# Patient Record
Sex: Female | Born: 1940 | Race: White | Hispanic: No | Marital: Married | Smoking: Former smoker
Health system: Southern US, Community
[De-identification: ages and names within clinical notes are randomized; demographics above are authoritative.]

## PROBLEM LIST (undated history)

## (undated) DIAGNOSIS — R030 Elevated blood-pressure reading, without diagnosis of hypertension: Secondary | ICD-10-CM

## (undated) DIAGNOSIS — J449 Chronic obstructive pulmonary disease, unspecified: Secondary | ICD-10-CM

---

## 2004-03-15 ENCOUNTER — Ambulatory Visit: Payer: Self-pay | Admitting: Family Medicine

## 2004-04-05 ENCOUNTER — Ambulatory Visit: Payer: Self-pay | Admitting: Family Medicine

## 2004-05-17 ENCOUNTER — Ambulatory Visit: Payer: Self-pay

## 2006-07-11 ENCOUNTER — Ambulatory Visit: Payer: Self-pay | Admitting: Gastroenterology

## 2009-08-15 ENCOUNTER — Ambulatory Visit: Payer: Self-pay | Admitting: Gastroenterology

## 2012-10-21 ENCOUNTER — Ambulatory Visit: Payer: Self-pay | Admitting: Family Medicine

## 2015-12-18 ENCOUNTER — Inpatient Hospital Stay
Admission: EM | Admit: 2015-12-18 | Discharge: 2015-12-28 | DRG: 208 | Disposition: A | Discharge status: Skilled Nursing Facility | Payer: Medicare Other | Attending: Internal Medicine | Admitting: Internal Medicine

## 2015-12-18 ENCOUNTER — Emergency Department: Payer: Medicare Other

## 2015-12-18 DIAGNOSIS — J9622 Acute and chronic respiratory failure with hypercapnia: Secondary | ICD-10-CM | POA: Diagnosis present

## 2015-12-18 DIAGNOSIS — R0603 Acute respiratory distress: Secondary | ICD-10-CM

## 2015-12-18 DIAGNOSIS — R0689 Other abnormalities of breathing: Secondary | ICD-10-CM | POA: Diagnosis present

## 2015-12-18 DIAGNOSIS — Z720 Tobacco use: Secondary | ICD-10-CM | POA: Diagnosis not present

## 2015-12-18 DIAGNOSIS — R410 Disorientation, unspecified: Secondary | ICD-10-CM | POA: Diagnosis not present

## 2015-12-18 DIAGNOSIS — G9341 Metabolic encephalopathy: Secondary | ICD-10-CM | POA: Diagnosis present

## 2015-12-18 DIAGNOSIS — J9690 Respiratory failure, unspecified, unspecified whether with hypoxia or hypercapnia: Secondary | ICD-10-CM | POA: Diagnosis present

## 2015-12-18 DIAGNOSIS — Z9981 Dependence on supplemental oxygen: Secondary | ICD-10-CM

## 2015-12-18 DIAGNOSIS — J208 Acute bronchitis due to other specified organisms: Secondary | ICD-10-CM | POA: Diagnosis present

## 2015-12-18 DIAGNOSIS — J209 Acute bronchitis, unspecified: Secondary | ICD-10-CM | POA: Diagnosis not present

## 2015-12-18 DIAGNOSIS — J9621 Acute and chronic respiratory failure with hypoxia: Secondary | ICD-10-CM | POA: Diagnosis present

## 2015-12-18 DIAGNOSIS — Z6821 Body mass index (BMI) 21.0-21.9, adult: Secondary | ICD-10-CM

## 2015-12-18 DIAGNOSIS — F17200 Nicotine dependence, unspecified, uncomplicated: Secondary | ICD-10-CM | POA: Diagnosis present

## 2015-12-18 DIAGNOSIS — Z4659 Encounter for fitting and adjustment of other gastrointestinal appliance and device: Secondary | ICD-10-CM

## 2015-12-18 DIAGNOSIS — J440 Chronic obstructive pulmonary disease with acute lower respiratory infection: Secondary | ICD-10-CM | POA: Diagnosis present

## 2015-12-18 DIAGNOSIS — E440 Moderate protein-calorie malnutrition: Secondary | ICD-10-CM | POA: Diagnosis present

## 2015-12-18 DIAGNOSIS — Z7951 Long term (current) use of inhaled steroids: Secondary | ICD-10-CM | POA: Diagnosis not present

## 2015-12-18 DIAGNOSIS — M6281 Muscle weakness (generalized): Secondary | ICD-10-CM

## 2015-12-18 DIAGNOSIS — J9601 Acute respiratory failure with hypoxia: Secondary | ICD-10-CM | POA: Diagnosis not present

## 2015-12-18 DIAGNOSIS — R2681 Unsteadiness on feet: Secondary | ICD-10-CM

## 2015-12-18 DIAGNOSIS — R0600 Dyspnea, unspecified: Secondary | ICD-10-CM

## 2015-12-18 DIAGNOSIS — R319 Hematuria, unspecified: Secondary | ICD-10-CM

## 2015-12-18 DIAGNOSIS — E873 Alkalosis: Secondary | ICD-10-CM | POA: Diagnosis present

## 2015-12-18 DIAGNOSIS — Z978 Presence of other specified devices: Secondary | ICD-10-CM

## 2015-12-18 DIAGNOSIS — J441 Chronic obstructive pulmonary disease with (acute) exacerbation: Principal | ICD-10-CM

## 2015-12-18 DIAGNOSIS — Z79899 Other long term (current) drug therapy: Secondary | ICD-10-CM

## 2015-12-18 DIAGNOSIS — J9602 Acute respiratory failure with hypercapnia: Secondary | ICD-10-CM | POA: Diagnosis not present

## 2015-12-18 DIAGNOSIS — I10 Essential (primary) hypertension: Secondary | ICD-10-CM | POA: Diagnosis not present

## 2015-12-18 DIAGNOSIS — R310 Gross hematuria: Secondary | ICD-10-CM

## 2015-12-18 DIAGNOSIS — I471 Supraventricular tachycardia: Secondary | ICD-10-CM | POA: Diagnosis not present

## 2015-12-18 DIAGNOSIS — Z9911 Dependence on respirator [ventilator] status: Secondary | ICD-10-CM | POA: Diagnosis not present

## 2015-12-18 DIAGNOSIS — J9600 Acute respiratory failure, unspecified whether with hypoxia or hypercapnia: Secondary | ICD-10-CM

## 2015-12-18 DIAGNOSIS — E872 Acidosis: Secondary | ICD-10-CM | POA: Diagnosis not present

## 2015-12-18 HISTORY — DX: Elevated blood-pressure reading, without diagnosis of hypertension: R03.0

## 2015-12-18 HISTORY — DX: Chronic obstructive pulmonary disease, unspecified: J44.9

## 2015-12-18 LAB — CBC WITH DIFFERENTIAL/PLATELET
BASOS PCT: 0 %
Basophils Absolute: 0 10*3/uL (ref 0–0.1)
EOS ABS: 0 10*3/uL (ref 0–0.7)
EOS PCT: 0 %
HCT: 45.6 % (ref 35.0–47.0)
HEMOGLOBIN: 14.9 g/dL (ref 12.0–16.0)
LYMPHS ABS: 0.9 10*3/uL — AB (ref 1.0–3.6)
Lymphocytes Relative: 6 %
MCH: 32 pg (ref 26.0–34.0)
MCHC: 32.7 g/dL (ref 32.0–36.0)
MCV: 98.1 fL (ref 80.0–100.0)
MONO ABS: 1.2 10*3/uL — AB (ref 0.2–0.9)
MONOS PCT: 9 %
NEUTROS PCT: 85 %
Neutro Abs: 11.7 10*3/uL — ABNORMAL HIGH (ref 1.4–6.5)
PLATELETS: 318 10*3/uL (ref 150–440)
RBC: 4.65 MIL/uL (ref 3.80–5.20)
RDW: 13.3 % (ref 11.5–14.5)
WBC: 13.8 10*3/uL — ABNORMAL HIGH (ref 3.6–11.0)

## 2015-12-18 LAB — COMPREHENSIVE METABOLIC PANEL
ALBUMIN: 3.8 g/dL (ref 3.5–5.0)
ALK PHOS: 97 U/L (ref 38–126)
ALT: 15 U/L (ref 14–54)
ANION GAP: 9 (ref 5–15)
AST: 29 U/L (ref 15–41)
BILIRUBIN TOTAL: 0.5 mg/dL (ref 0.3–1.2)
BUN: 25 mg/dL — ABNORMAL HIGH (ref 6–20)
CALCIUM: 9.8 mg/dL (ref 8.9–10.3)
CO2: 41 mmol/L — ABNORMAL HIGH (ref 22–32)
CREATININE: 0.64 mg/dL (ref 0.44–1.00)
Chloride: 92 mmol/L — ABNORMAL LOW (ref 101–111)
GFR calc Af Amer: >60 mL/min (ref >60)
GFR calc non Af Amer: >60 mL/min (ref >60)
GLUCOSE: 190 mg/dL — AB (ref 65–99)
Potassium: 3.9 mmol/L (ref 3.5–5.1)
Sodium: 142 mmol/L (ref 135–145)
TOTAL PROTEIN: 7.8 g/dL (ref 6.5–8.1)

## 2015-12-18 LAB — BLOOD GAS, VENOUS
Delivery systems: POSITIVE
FIO2: 0.36
Patient temperature: 37
pCO2, Ven: 120 mmHg (ref 44.0–60.0)
pH, Ven: 7.15 — CL (ref 7.250–7.430)

## 2015-12-18 LAB — INFLUENZA PANEL BY PCR (TYPE A & B)
INFLAPCR: NEGATIVE
Influenza B By PCR: NEGATIVE

## 2015-12-18 LAB — BLOOD GAS, ARTERIAL
Acid-Base Excess: 16 mmol/L — ABNORMAL HIGH (ref 0.0–2.0)
Bicarbonate: 45.4 mmol/L — ABNORMAL HIGH (ref 20.0–28.0)
DELIVERY SYSTEMS: POSITIVE
Expiratory PAP: 10
FIO2: 0.36
INSPIRATORY PAP: 22
O2 Saturation: 89.4 %
PO2 ART: 58 mmHg — AB (ref 83.0–108.0)
Patient temperature: 37
pCO2 arterial: 75 mmHg (ref 32.0–48.0)
pH, Arterial: 7.39 (ref 7.350–7.450)

## 2015-12-18 LAB — LACTIC ACID, PLASMA: Lactic Acid, Venous: 1.5 mmol/L (ref 0.5–1.9)

## 2015-12-18 LAB — PROTIME-INR
INR: 1.04
PROTHROMBIN TIME: 13.6 s (ref 11.4–15.2)

## 2015-12-18 LAB — MRSA PCR SCREENING: MRSA by PCR: NEGATIVE

## 2015-12-18 MED ORDER — ALBUTEROL SULFATE (2.5 MG/3ML) 0.083% IN NEBU
2.5000 mg | INHALATION_SOLUTION | Freq: Once | RESPIRATORY_TRACT | Status: AC
  Administered 2015-12-18: 2.5 mg via RESPIRATORY_TRACT

## 2015-12-18 MED ORDER — ACETAMINOPHEN 325 MG PO TABS
650.0000 mg | ORAL_TABLET | ORAL | Status: DC | PRN
  Administered 2015-12-25 – 2015-12-27 (×2): 650 mg via ORAL
  Filled 2015-12-18 (×2): qty 2

## 2015-12-18 MED ORDER — ALBUTEROL SULFATE (2.5 MG/3ML) 0.083% IN NEBU
INHALATION_SOLUTION | RESPIRATORY_TRACT | Status: AC
Start: 2015-12-18 — End: 2015-12-18
  Administered 2015-12-18: 2.5 mg via RESPIRATORY_TRACT
  Filled 2015-12-18: qty 12

## 2015-12-18 MED ORDER — SODIUM CHLORIDE 0.9% FLUSH: 3.0000 mL | INTRAVENOUS | Status: DC | PRN

## 2015-12-18 MED ORDER — SODIUM CHLORIDE 0.9 % IV SOLN: 250.0000 mL | INTRAVENOUS | Status: DC | PRN

## 2015-12-18 MED ORDER — ENOXAPARIN SODIUM 40 MG/0.4ML ~~LOC~~ SOLN
40.0000 mg | SUBCUTANEOUS | Status: DC
  Administered 2015-12-18 – 2015-12-19 (×2): 40 mg via SUBCUTANEOUS
  Filled 2015-12-18 (×2): qty 0.4

## 2015-12-18 MED ORDER — METHYLPREDNISOLONE SODIUM SUCC 40 MG IJ SOLR
40.0000 mg | Freq: Two times a day (BID) | INTRAMUSCULAR | Status: DC
  Administered 2015-12-18 – 2015-12-19 (×2): 40 mg via INTRAVENOUS
  Filled 2015-12-18 (×2): qty 1

## 2015-12-18 MED ORDER — IPRATROPIUM-ALBUTEROL 0.5-2.5 (3) MG/3ML IN SOLN
3.0000 mL | RESPIRATORY_TRACT | Status: DC
  Administered 2015-12-18 – 2015-12-19 (×8): 3 mL via RESPIRATORY_TRACT
  Filled 2015-12-18 (×8): qty 3

## 2015-12-18 MED ORDER — HYDRALAZINE HCL 20 MG/ML IJ SOLN
5.0000 mg | INTRAMUSCULAR | Status: DC | PRN
  Administered 2015-12-18 – 2015-12-23 (×5): 5 mg via INTRAVENOUS
  Filled 2015-12-18 (×5): qty 1

## 2015-12-18 MED ORDER — MORPHINE SULFATE (PF) 4 MG/ML IV SOLN
2.0000 mg | INTRAVENOUS | Status: DC | PRN
  Administered 2015-12-18 – 2015-12-24 (×2): 2 mg via INTRAVENOUS
  Filled 2015-12-18 (×2): qty 1

## 2015-12-18 MED ORDER — ORAL CARE MOUTH RINSE
15.0000 mL | Freq: Two times a day (BID) | OROMUCOSAL | Status: DC
  Administered 2015-12-18 – 2015-12-21 (×7): 15 mL via OROMUCOSAL

## 2015-12-18 MED ORDER — CEFTRIAXONE SODIUM-DEXTROSE 1-3.74 GM-% IV SOLR
1.0000 g | INTRAVENOUS | Status: AC
  Administered 2015-12-18 – 2015-12-23 (×6): 1 g via INTRAVENOUS
  Filled 2015-12-18 (×8): qty 50

## 2015-12-18 MED ORDER — ONDANSETRON HCL 4 MG/2ML IJ SOLN: 4.0000 mg | Freq: Four times a day (QID) | INTRAMUSCULAR | Status: DC | PRN

## 2015-12-18 MED ORDER — GUAIFENESIN 100 MG/5ML PO SOLN
5.0000 mL | ORAL | Status: DC | PRN
  Administered 2015-12-18 – 2015-12-25 (×2): 100 mg via ORAL
  Filled 2015-12-18 (×2): qty 10

## 2015-12-18 MED ORDER — BUDESONIDE 0.5 MG/2ML IN SUSP
0.5000 mg | Freq: Two times a day (BID) | RESPIRATORY_TRACT | Status: DC
  Administered 2015-12-18 – 2015-12-28 (×20): 0.5 mg via RESPIRATORY_TRACT
  Filled 2015-12-18 (×22): qty 2

## 2015-12-18 MED ORDER — SODIUM CHLORIDE 0.9% FLUSH
3.0000 mL | Freq: Two times a day (BID) | INTRAVENOUS | Status: DC
  Administered 2015-12-18 – 2015-12-27 (×19): 3 mL via INTRAVENOUS

## 2015-12-18 MED ORDER — DEXTROSE 5 % IV SOLN
500.0000 mg | INTRAVENOUS | Status: AC
  Administered 2015-12-18 – 2015-12-23 (×6): 500 mg via INTRAVENOUS
  Filled 2015-12-18 (×6): qty 500

## 2015-12-18 MED ORDER — FAMOTIDINE IN NACL 20-0.9 MG/50ML-% IV SOLN
20.0000 mg | Freq: Two times a day (BID) | INTRAVENOUS | Status: DC
  Administered 2015-12-18 – 2015-12-25 (×15): 20 mg via INTRAVENOUS
  Filled 2015-12-18 (×15): qty 50

## 2015-12-18 NOTE — ED Triage Notes (Signed)
Pt came to ED via EMS from home. Per EMS, pts sister found pt slumped over in chair. On arrival EMS repots satting 88% r/a. Given 2 duonebs, 2 albuterol treatments, 125 solumedrol, 2 mg magnesium, and placed on cpap.

## 2015-12-18 NOTE — ED Notes (Signed)
Pt now becoming more alert. Able to state name and follow some commands.

## 2015-12-18 NOTE — Progress Notes (Signed)
Patient transferred from ER to ICU bed 17, while on the V60 BIPAP, with no complications. Patients IPAP was increased to 22cmh20 by Dr.Kasa while he was in assessing the patient in the Er. Current settings are 22/10 and 36%. Patient tol well at this time.

## 2015-12-18 NOTE — H&P (Signed)
PULMONARY / CRITICAL CARE MEDICINE   Name: Claudia Mercado MRN: [id number] DOB: [birth year]    ADMISSION DATE:  12/18/2015  CHIEF COMPLAINT:  resp failure    HISTORY OF PRESENT ILLNESS:  75 y.o. female with a history of COPD who was found by her sister reportedly slumped over and barely breathing. -EMS stated that her O2 sat was 88% on maybe 3 L outpatient nasal cannula oxygen, but minimally responsive, extremely tight/not moving air, and end-tidal CO2 reading in the 80s.  -She was placed on BiPAP and given magnesium, 2 or 3 DuoNeb's, and Solu-Medrol and was starting to wake up to voice and interact at the time upon ED arrival. -however at time of my arrival, patient remains lethargic increased WOB, on biPAP Has b/l wheezing and rhonchi  Patient sisiter at bedside, patient wears 2 L [place] at home and still smokes Patient at very high risk for intubation  PAST MEDICAL HISTORY :   has a past medical history of COPD (chronic obstructive pulmonary disease) (HCC).  has no past surgical history on file. Prior to Admission medications   Medication Sig Start Date End Date Taking? Authorizing Provider  atorvastatin (LIPITOR) 40 MG tablet Take 1 tablet by mouth daily. 12/29/14 12/29/15 Yes Historical Provider, MD  gabapentin (NEURONTIN) 300 MG capsule Take 1 capsule by mouth 3 (three) times daily. 12/11/15  Yes Historical Provider, MD  SPIRIVA HANDIHALER 18 MCG inhalation capsule Place 1 capsule into inhaler and inhale as needed. 12/02/15  Yes Historical Provider, MD  SYMBICORT 160-4.5 MCG/ACT inhaler Inhale 2 puffs into the lungs 2 (two) times daily. 11/19/15  Yes Historical Provider, MD  VENTOLIN HFA 108 (90 Base) MCG/ACT inhaler Inhale 2 puffs into the lungs every 4 (four) hours as needed. 12/07/15  Yes Historical Provider, MD  nitroGLYCERIN (NITROSTAT) 0.4 MG SL tablet Place 1 tablet under the tongue every 5 (five) minutes x 3 doses as needed. Place 1 tablet under tongue every 5 minutes as needed  for chest pain for up to 3 doses 09/27/15   Historical Provider, MD    REVIEW OF SYSTEMS:  Unobtainable due to critical illness    VITAL SIGNS: Temp:  [97.4 F (36.3 C)] 97.4 F (36.3 C) (12/03 1304) Pulse Rate:  [95-103] 103 (12/03 1330) Resp:  [23-36] 23 (12/03 1330) BP: (118-152)/(76-87) 118/84 (12/03 1330) SpO2:  [96 %-98 %] 98 % (12/03 1330) Weight:  [130 lb (59 kg)] 130 lb (59 kg) (12/03 1337) HEMODYNAMICS:   PHYSICAL EXAMINATION:  GENERAL:critically ill appearing, +resp distress HEAD: Normocephalic, atraumatic.  EYES: Pupils equal, round, reactive to light.  No scleral icterus.  MOUTH: Moist mucosal membrane. NECK: Supple. No thyromegaly. No nodules. No JVD.  PULMONARY: +rhonchi, +wheezing CARDIOVASCULAR: S1 and S2. Regular rate and rhythm. No murmurs, rubs, or gallops.  GASTROINTESTINAL: Soft, nontender, -distended. No masses. Positive bowel sounds. No hepatosplenomegaly.  MUSCULOSKELETAL: No swelling, clubbing, or edema.  NEUROLOGIC: obtunded SKIN:intact,warm,dry    LABS:  CBC  Recent Labs Lab 12/18/15 1256  WBC 13.8*  HGB 14.9  HCT 45.6  PLT 318   Coag's No results for input(s): APTT, INR in the last 168 hours. BMET No results for input(s): NA, K, CL, CO2, BUN, CREATININE, GLUCOSE in the last 168 hours. Electrolytes No results for input(s): CALCIUM, MG, PHOS in the last 168 hours. Sepsis Markers  Recent Labs Lab 12/18/15 1307  LATICACIDVEN 1.5   ABG No results for input(s): PHART, PCO2ART, PO2ART in the last 168 hours. Liver Enzymes No results for  input(s): AST, ALT, ALKPHOS, BILITOT, ALBUMIN in the last 168 hours. Cardiac Enzymes No results for input(s): TROPONINI, PROBNP in the last 168 hours. Glucose No results for input(s): GLUCAP in the last 168 hours.  Imaging Dg Chest Portable 1 View  Result Date: 12/18/2015 CLINICAL DATA:  75 year old female with history of respiratory distress. History of COPD. EXAM: PORTABLE CHEST 1 VIEW  COMPARISON:  No priors. FINDINGS: Probable small to moderate-sized subpulmonic fluid collection in the right hemithorax. No acute consolidative airspace disease. No pneumothorax. Emphysematous changes. No evidence of pulmonary edema. Mild diffuse peribronchial cuffing. Heart size is normal. The patient is rotated to the right on today's exam, resulting in distortion of the mediastinal contours and reduced diagnostic sensitivity and specificity for mediastinal pathology. Atherosclerosis in the thoracic aorta. IMPRESSION: 1. Probable mild small to moderate right subpulmonic pleural effusion. 2. Diffuse peribronchial cuffing, concerning for an acute bronchitis. 3. Aortic atherosclerosis. 4. Emphysema. Electronically Signed   By: Trudie Reedaniel  Entrikin M.D.   On: 12/18/2015 13:51     ASSESSMENT / PLAN:   75 yo white female with acute in chronic resp failure from acute COPD exacerbation likely from viral bronchitis   PULMONARY -Respiratory Failure -attempt BiPAP therapy -aggressive BD therapy IV steroids and IV abx Titrate fio2 to keep sats 88-92% High risk for intubation  CARDIOVASCULAR Will need ICU monitoring  RENAL Watch uop  GASTROINTESTINAL Keep NPO  HEMATOLOGIC Follow CBC as needed  INFECTIOUS Empiric abx -check FLU Panel   NEUROLOGIC encephlaopthy from hyerpcarbia -avoid benzo's Morphine as needed for increased WOB   DVT prx with lovenox FAMILY  - Updates: sister updated   I have personally obtained a history, examined the patient, evaluated Pertinent laboratory and Radio[employer]/imaging results, and  formulated the assessment and plan   The Patient requires high complexity decision making for assessment and support, frequent evaluation and titration of therapies, application of advanced monitoring technologies and extensive interpretation of multiple databases. Critical Care Time devoted to patient care services described in this note is 45 minutes.   Overall, patient  is critically ill, prognosis is guarded.   high risk for cardiac arrest and death.    Lucie LeatherKurian David Astin Rape, M.D.  [facility]  Medical Director [facility] Medical Director [facility]

## 2015-12-18 NOTE — ED Notes (Signed)
X-ray at bedside

## 2015-12-18 NOTE — ED Provider Notes (Signed)
[facility] Emergency Department Provider Note ____________________________________________   I have reviewed the triage vital signs and the triage nursing note.  HISTORY  Chief Complaint Respiratory Distress   Historian Level V caveat, altered mental status, respiratory distress, severe illness History per EMS report from sister with the patient was picked up  HPI Claudia Mercado is a 75 y.o. female with a history of COPD who was found by her sister reportedly slumped over and barely breathing. EMS stated that her O2 sat was 88% on maybe 3 L outpatient nasal cannula oxygen, but minimally responsive, extremely tight/not moving air, and end-tidal CO2 reading in the 80s. She was placed on BiPAP and given magnesium, 2 or 3 DuoNeb's, and Solu-Medrol and was starting to wake up to voice and interact at the time upon ED arrival.  No additional information was available in terms of fevers, cough, or onset.  Patient shakes her head no about any chest pain.   Past Medical History:  Diagnosis Date  . COPD (chronic obstructive pulmonary disease) (HCC)     There are no active problems to display for this patient.   History reviewed. No pertinent surgical history.  Prior to Admission medications   Medication Sig Start Date End Date Taking? Authorizing Provider  atorvastatin (LIPITOR) 40 MG tablet Take 1 tablet by mouth daily. 12/29/14 12/29/15 Yes Historical Provider, MD  gabapentin (NEURONTIN) 300 MG capsule Take 1 capsule by mouth 3 (three) times daily. 12/11/15  Yes Historical Provider, MD  SPIRIVA HANDIHALER 18 MCG inhalation capsule Place 1 capsule into inhaler and inhale as needed. 12/02/15  Yes Historical Provider, MD  SYMBICORT 160-4.5 MCG/ACT inhaler Inhale 2 puffs into the lungs 2 (two) times daily. 11/19/15  Yes Historical Provider, MD  VENTOLIN HFA 108 (90 Base) MCG/ACT inhaler Inhale 2 puffs into the lungs every 4 (four) hours as needed. 12/07/15  Yes  Historical Provider, MD  nitroGLYCERIN (NITROSTAT) 0.4 MG SL tablet Place 1 tablet under the tongue every 5 (five) minutes x 3 doses as needed. Place 1 tablet under tongue every 5 minutes as needed for chest pain for up to 3 doses 09/27/15   Historical Provider, MD    No Known Allergies  No family history on file.  Social History Social History  Substance Use Topics  . Smoking status: Not on file  . Smokeless tobacco: Not on file  . Alcohol use Not on file    Review of Systems Unable to obtain due to critical illness and altered mental status, respiratory distress  Denies chest pain.   ____________________________________________   PHYSICAL EXAM:  VITAL SIGNS: ED Triage Vitals  Enc Vitals Group     BP 12/18/15 1304 (!) 152/87     Pulse Rate 12/18/15 1250 95     Resp 12/18/15 1250 (!) 36     Temp 12/18/15 1304 97.4 F (36.3 C)     Temp Source 12/18/15 1304 Axillary     SpO2 12/18/15 1250 96 %     Weight --      Height 12/18/15 1304 5\' 5"  (1.651 m)     Head Circumference --      Peak Flow --      Pain Score --      Pain Loc --      Pain Edu? --      Excl. in GC? --      Constitutional: Opens eyes to voice and shakes head yes and no. Still with respiratory distress.  HEENT  Head: Normocephalic and atraumatic.      Eyes: Conjunctivae are normal. PERRL. Normal extraocular movements.      Ears:         Nose: No congestion/rhinnorhea.   Mouth/Throat: Mucous membranes are mildly dry.   Neck: No stridor.   Cardiovascular/Chest: Normal rate, regular rhythm.  No murmurs, rubs, or gallops. Respiratory: Very little air movement, tachypnea, no rhonchi or rales. Gastrointestinal: Soft. No distention.  Genitourinary/rectal:Deferred Musculoskeletal: Normal extremities.. No lower extremity edema. Neurologic:  No facial droop. Follows commands. Limited verbal due to respiratory distress. No gross or focal neurologic deficits are appreciated. Skin:  Skin is warm, dry  and intact. No rash noted.   ____________________________________________  LABS (pertinent positives/negatives)  Labs Reviewed  BLOOD GAS, VENOUS - Abnormal; Notable for the following:       Result Value   pCO2, Ven 120 (*)    All other components within normal limits  CULTURE, BLOOD (ROUTINE X 2)  CULTURE, BLOOD (ROUTINE X 2)  LACTIC ACID, PLASMA  COMPREHENSIVE METABOLIC PANEL  LACTIC ACID, PLASMA  CBC WITH DIFFERENTIAL/PLATELET  PROTIME-INR    ____________________________________________    EKG I, Governor Rooks, MD, the attending physician have personally viewed and interpreted all ECGs.  102 bpm. Sinus tachycardia. Narrow QRS. Normal axis. Normal ST and T-wave ____________________________________________  RADIOLOGY All Xrays were viewed by me. Imaging interpreted by Radiologist.  Chest x-ray portable:  IMPRESSION: 1. Probable mild small to moderate right subpulmonic pleural effusion. 2. Diffuse peribronchial cuffing, concerning for an acute bronchitis. 3. Aortic atherosclerosis. 4. Emphysema. __________________________________________  PROCEDURES  Procedure(s) performed: None  Critical Care performed: CRITICAL CARE Performed by: Governor Rooks   Total critical care time: 30 minutes  Critical care time was exclusive of separately billable procedures and treating other patients.  Critical care was necessary to treat or prevent imminent or life-threatening deterioration.  Critical care was time spent personally by me on the following activities: development of treatment plan with patient and/or surrogate as well as nursing, discussions with consultants, evaluation of patient's response to treatment, examination of patient, obtaining history from patient or surrogate, ordering and performing treatments and interventions, ordering and review of laboratory studies, ordering and review of radiographic studies, pulse oximetry and re-evaluation of patient's  condition.   ____________________________________________   ED COURSE / ASSESSMENT AND PLAN  Pertinent labs & imaging results that were available during my care of the patient were reviewed by me and considered in my medical decision making (see chart for details).  Ms. Siebel was brought in emergency traffic by EMS for respiratory distress and altered mental status. She is responding to voice now and per EMS this is a significant improvement from when they found her. Symptoms seem most consistent with likely COPD exacerbation and hypercarbia which is now starting to be somewhat improved.  Chest x-ray shows no focal infiltrate. She's not febrile. Patient was transitioned over to BiPAP. Respiratory rate improved from 40s to 30s. Mental status continued to improve over time in the emergency department.  She received slight Medrol magnesium prior to arrival. She was given a continuous 10 mg albuterol nebulizer in the emergency department.  I discussed with the intensivist Dr. Belia Heman for hospital admission.    CONSULTATIONS: ICU physician for admission.  Patient / Family / Caregiver informed of clinical course, medical decision-making process, and agree with plan.     ___________________________________________   FINAL CLINICAL IMPRESSION(S) / ED DIAGNOSES   Final diagnoses:  COPD exacerbation (HCC)  Respiratory distress  Acute respiratory failure with hypoxia (HCC)  Hypercarbia              Note: This dictation was prepared with Dragon dictation. Any transcriptional errors that result from this process are unintentional    Governor Rooksebecca Leanord Thibeau, MD 12/18/15 1418

## 2015-12-18 NOTE — Progress Notes (Signed)
Pharmacy Antibiotic Note  Josephina Shiheggy F Daino is a 75 y.o. female admitted on 12/18/2015 with COPD exacerbation, Respiratory distress/acutre respiratory failure, and possible PNA.    Pharmacy has been consulted for Ceftriaxone and Azithromycin dosing.  Plan: Will start the patient on Ceftriaxone 1gm IV every 24 hours and Azithromycin 500mg  IV every 24 hours.   Height: 5\' 5"  (165.1 cm) Weight: 130 lb (59 kg) IBW/kg (Calculated) : 57  Temp (24hrs), Avg:97.4 F (36.3 C), Min:97.4 F (36.3 C), Max:97.4 F (36.3 C)   Recent Labs Lab 12/18/15 1256 12/18/15 1307  WBC 13.8*  --   LATICACIDVEN  --  1.5    CrCl cannot be calculated (No order found.).    No Known Allergies  Antimicrobials this admission: 12/3 Ceftriaxone >>  12/3 Azithromycin >>   Dose adjustments this admission:   Microbiology results: 12/3 BCx: sent 12/3 MRSA PCR:   Thank you for allowing pharmacy to be a part of this patient's care.  Gardner CandleSheema M Tanny Harnack, PharmD, BCPS Clinical Pharmacist 12/18/2015 2:50 PM

## 2015-12-18 NOTE — Progress Notes (Signed)
Pt on bipap, sats in mid 90s. Respirations tachy on bipap. Received IV ABX. Sister at bedside. Denies pain and discomfort. Complained of feeling cold and was given a warm blanket. Monitor sats and breathing, since pt is high risk for being on ventilator per MD note.

## 2015-12-19 ENCOUNTER — Inpatient Hospital Stay: Payer: Medicare Other

## 2015-12-19 DIAGNOSIS — J9602 Acute respiratory failure with hypercapnia: Secondary | ICD-10-CM

## 2015-12-19 DIAGNOSIS — Z720 Tobacco use: Secondary | ICD-10-CM

## 2015-12-19 LAB — CBC
HEMATOCRIT: 42.5 % (ref 35.0–47.0)
Hemoglobin: 14 g/dL (ref 12.0–16.0)
MCH: 31.3 pg (ref 26.0–34.0)
MCHC: 33 g/dL (ref 32.0–36.0)
MCV: 95.1 fL (ref 80.0–100.0)
PLATELETS: 278 10*3/uL (ref 150–440)
RBC: 4.47 MIL/uL (ref 3.80–5.20)
RDW: 13.1 % (ref 11.5–14.5)
WBC: 12.5 10*3/uL — AB (ref 3.6–11.0)

## 2015-12-19 LAB — BLOOD CULTURE ID PANEL (REFLEXED)
ACINETOBACTER BAUMANNII: NOT DETECTED
Candida albicans: NOT DETECTED
Candida glabrata: NOT DETECTED
Candida krusei: NOT DETECTED
Candida parapsilosis: NOT DETECTED
Candida tropicalis: NOT DETECTED
ENTEROCOCCUS SPECIES: NOT DETECTED
Enterobacter cloacae complex: NOT DETECTED
Enterobacteriaceae species: NOT DETECTED
Escherichia coli: NOT DETECTED
HAEMOPHILUS INFLUENZAE: NOT DETECTED
Klebsiella oxytoca: NOT DETECTED
Klebsiella pneumoniae: NOT DETECTED
LISTERIA MONOCYTOGENES: NOT DETECTED
METHICILLIN RESISTANCE: NOT DETECTED
NEISSERIA MENINGITIDIS: NOT DETECTED
PROTEUS SPECIES: NOT DETECTED
Pseudomonas aeruginosa: NOT DETECTED
SERRATIA MARCESCENS: NOT DETECTED
STAPHYLOCOCCUS AUREUS BCID: NOT DETECTED
STAPHYLOCOCCUS SPECIES: DETECTED — AB
STREPTOCOCCUS AGALACTIAE: NOT DETECTED
STREPTOCOCCUS SPECIES: NOT DETECTED
Streptococcus pneumoniae: NOT DETECTED
Streptococcus pyogenes: NOT DETECTED

## 2015-12-19 LAB — BLOOD GAS, ARTERIAL
ACID-BASE EXCESS: 19.3 mmol/L — AB (ref 0.0–2.0)
Bicarbonate: 49.6 mmol/L — ABNORMAL HIGH (ref 20.0–28.0)
DELIVERY SYSTEMS: POSITIVE
Expiratory PAP: 10
FIO2: 0.36
Inspiratory PAP: 22
O2 Saturation: 96.4 %
PATIENT TEMPERATURE: 37
PCO2 ART: 82 mmHg — AB (ref 32.0–48.0)
PO2 ART: 86 mmHg (ref 83.0–108.0)
pH, Arterial: 7.39 (ref 7.350–7.450)

## 2015-12-19 LAB — BASIC METABOLIC PANEL
ANION GAP: 8 (ref 5–15)
BUN: 32 mg/dL — ABNORMAL HIGH (ref 6–20)
CHLORIDE: 92 mmol/L — AB (ref 101–111)
CO2: 41 mmol/L — ABNORMAL HIGH (ref 22–32)
Calcium: 9.4 mg/dL (ref 8.9–10.3)
Creatinine, Ser: 0.57 mg/dL (ref 0.44–1.00)
GFR calc Af Amer: >60 mL/min (ref >60)
GLUCOSE: 205 mg/dL — AB (ref 65–99)
POTASSIUM: 3.7 mmol/L (ref 3.5–5.1)
Sodium: 141 mmol/L (ref 135–145)

## 2015-12-19 LAB — GLUCOSE, CAPILLARY: GLUCOSE-CAPILLARY: 183 mg/dL — AB (ref 65–99)

## 2015-12-19 LAB — PROCALCITONIN: Procalcitonin: <0.1 ng/mL

## 2015-12-19 MED ORDER — DEXMEDETOMIDINE HCL IN NACL 400 MCG/100ML IV SOLN
0.4000 ug/kg/h | INTRAVENOUS | Status: DC
  Administered 2015-12-19: 0.945 ug/kg/h via INTRAVENOUS
  Administered 2015-12-19: 0.9 ug/kg/h via INTRAVENOUS
  Administered 2015-12-19: 0.5 ug/kg/h via INTRAVENOUS
  Administered 2015-12-20: 2 ug/kg/h via INTRAVENOUS
  Filled 2015-12-19 (×4): qty 100

## 2015-12-19 MED ORDER — METHYLPREDNISOLONE SODIUM SUCC 125 MG IJ SOLR
60.0000 mg | Freq: Three times a day (TID) | INTRAMUSCULAR | Status: DC
  Administered 2015-12-19 – 2015-12-21 (×6): 60 mg via INTRAVENOUS
  Filled 2015-12-19 (×6): qty 2

## 2015-12-19 MED ORDER — IPRATROPIUM-ALBUTEROL 0.5-2.5 (3) MG/3ML IN SOLN
3.0000 mL | Freq: Four times a day (QID) | RESPIRATORY_TRACT | Status: DC
  Administered 2015-12-20 – 2015-12-28 (×30): 3 mL via RESPIRATORY_TRACT
  Filled 2015-12-19 (×33): qty 3

## 2015-12-19 MED ORDER — MORPHINE SULFATE (PF) 4 MG/ML IV SOLN
2.0000 mg | Freq: Once | INTRAVENOUS | Status: AC
  Administered 2015-12-19: 2 mg via INTRAVENOUS
  Filled 2015-12-19: qty 1

## 2015-12-19 NOTE — Progress Notes (Signed)
PHARMACY - PHYSICIAN COMMUNICATION CRITICAL VALUE ALERT - BLOOD CULTURE IDENTIFICATION (BCID)  Results for orders placed or performed during the hospital encounter of 12/18/15  Blood Culture ID Panel (Reflexed) (Collected: 12/18/2015  1:12 PM)  Result Value Ref Range   Enterococcus species NOT DETECTED NOT DETECTED   Listeria monocytogenes NOT DETECTED NOT DETECTED   Staphylococcus species DETECTED (A) NOT DETECTED   Staphylococcus aureus NOT DETECTED NOT DETECTED   Methicillin resistance NOT DETECTED NOT DETECTED   Streptococcus species NOT DETECTED NOT DETECTED   Streptococcus agalactiae NOT DETECTED NOT DETECTED   Streptococcus pneumoniae NOT DETECTED NOT DETECTED   Streptococcus pyogenes NOT DETECTED NOT DETECTED   Acinetobacter baumannii NOT DETECTED NOT DETECTED   Enterobacteriaceae species NOT DETECTED NOT DETECTED   Enterobacter cloacae complex NOT DETECTED NOT DETECTED   Escherichia coli NOT DETECTED NOT DETECTED   Klebsiella oxytoca NOT DETECTED NOT DETECTED   Klebsiella pneumoniae NOT DETECTED NOT DETECTED   Proteus species NOT DETECTED NOT DETECTED   Serratia marcescens NOT DETECTED NOT DETECTED   Haemophilus influenzae NOT DETECTED NOT DETECTED   Neisseria meningitidis NOT DETECTED NOT DETECTED   Pseudomonas aeruginosa NOT DETECTED NOT DETECTED   Candida albicans NOT DETECTED NOT DETECTED   Candida glabrata NOT DETECTED NOT DETECTED   Candida krusei NOT DETECTED NOT DETECTED   Candida parapsilosis NOT DETECTED NOT DETECTED   Candida tropicalis NOT DETECTED NOT DETECTED    Name of physician (or Provider) Contacted: Ramachandran  Changes to prescribed antibiotics required: MD would like assess prior to changing antimicrobial therapy.    Makela Niehoff D 12/19/2015  8:16 AM

## 2015-12-19 NOTE — H&P (Signed)
PULMONARY / CRITICAL CARE MEDICINE   Name: Claudia Mercado MRN: [id number] DOB: 04-27-1940    ADMISSION DATE:  12/18/2015  75 yo white female smoker with acute on chronic resp hypoxic and hypercapnic failure from acute COPD exacerbation likely from viral bronchitis   PULMONARY -Respiratory Failure-- continue bipap. At high risk of intubation.  -aggressive BD therapy IV steroids and IV abx, we'll increase Solu-Medrol Titrate fio2 to keep sats 88-92% High risk for intubation  CARDIOVASCULAR Will need ICU monitoring  RENAL Watch uop  GASTROINTESTINAL Keep NPO  HEMATOLOGIC Follow CBC as needed  INFECTIOUS Empiric abx -check FLU Panel  Antibiotics Ceftriaxone 12/3>> Azithromycin 12/3>>  Micro: Influenza 12/3: Negative Blood cultures 12/3: Negative thus far; positive for staph species    NEUROLOGIC Metabolic encephalopathy. -avoid benzo's   DVT prx with lovenox  FAMILY    ---------------------------------------------------  Subjective: No acute events overnight, the patient was maintained on BiPAP overnight  --REVIEW OF SYSTEMS:  Unobtainable due to critical illness    VITAL SIGNS: Temp:  [97.4 F (36.3 C)-98.6 F (37 C)] 98.5 F (36.9 C) (12/04 0200) Pulse Rate:  [95-112] 111 (12/04 0000) Resp:  [20-39] 23 (12/04 0000) BP: (102-162)/(66-100) 102/66 (12/04 0000) SpO2:  [93 %-98 %] 96 % (12/04 0824) FiO2 (%):  [36 %] 36 % (12/04 0348) Weight:  [130 lb (59 kg)-139 lb 15.9 oz (63.5 kg)] 139 lb 15.9 oz (63.5 kg) (12/04 0510) HEMODYNAMICS:   PHYSICAL EXAMINATION:  GENERAL:critically ill appearing, +resp distress HEAD: Normocephalic, atraumatic.  EYES: Pupils equal, round, reactive to light.  No scleral icterus.  MOUTH: Moist mucosal membrane. NECK: Supple. No thyromegaly. No nodules. No JVD.  PULMONARY: +rhonchi, +wheezing CARDIOVASCULAR: S1 and S2. Regular rate and rhythm. No murmurs, rubs, or gallops.  GASTROINTESTINAL: Soft, nontender,  -distended. No masses. Positive bowel sounds. No hepatosplenomegaly.  MUSCULOSKELETAL: No swelling, clubbing, or edema.  NEUROLOGIC: obtunded SKIN:intact,warm,dry    LABS:  CBC  Recent Labs Lab 12/18/15 1256 12/19/15 0407  WBC 13.8* 12.5*  HGB 14.9 14.0  HCT 45.6 42.5  PLT 318 278   Coag's  Recent Labs Lab 12/18/15 1256  INR 1.04   BMET  Recent Labs Lab 12/18/15 1256 12/19/15 0407  NA 142 141  K 3.9 3.7  CL 92* 92*  CO2 41* 41*  BUN 25* 32*  CREATININE 0.64 0.57  GLUCOSE 190* 205*   Electrolytes  Recent Labs Lab 12/18/15 1256 12/19/15 0407  CALCIUM 9.8 9.4   Sepsis Markers  Recent Labs Lab 12/18/15 1307  LATICACIDVEN 1.5   ABG  Recent Labs Lab 12/18/15 1951 12/19/15 0347  PHART 7.39 7.39  PCO2ART 75* 82*  PO2ART 58* 86   Liver Enzymes  Recent Labs Lab 12/18/15 1256  AST 29  ALT 15  ALKPHOS 97  BILITOT 0.5  ALBUMIN 3.8   Cardiac Enzymes No results for input(s): TROPONINI, PROBNP in the last 168 hours. Glucose No results for input(s): GLUCAP in the last 168 hours.  Imaging Dg Chest Port 1 View  Result Date: 12/19/2015 CLINICAL DATA:  75 year old female with recent respiratory distress. COPD. Initial encounter. EXAM: PORTABLE CHEST 1 VIEW COMPARISON:  12/18/2015. FINDINGS: Portable AP upright view at 0528 hours. Mildly more kyphotic positioning the knee yesterday. Stable cardiac size and mediastinal contours. Stable or perhaps mildly regressed pulmonary interstitial opacity with no pneumothorax. Stable blunting of the right lung base. No other confluent pulmonary opacity. No left pleural effusion suspected. IMPRESSION: Stable chest. Continued opacity at the right lung base which could  reflect small pleural effusion and/or airspace disease, and suspected underlying chronic lung disease. Electronically Signed   By: Odessa FlemingH  Hall M.D.   On: 12/19/2015 07:38   Dg Chest Portable 1 View  Result Date: 12/18/2015 CLINICAL DATA:  75 year old  female with history of respiratory distress. History of COPD. EXAM: PORTABLE CHEST 1 VIEW COMPARISON:  No priors. FINDINGS: Probable small to moderate-sized subpulmonic fluid collection in the right hemithorax. No acute consolidative airspace disease. No pneumothorax. Emphysematous changes. No evidence of pulmonary edema. Mild diffuse peribronchial cuffing. Heart size is normal. The patient is rotated to the right on today's exam, resulting in distortion of the mediastinal contours and reduced diagnostic sensitivity and specificity for mediastinal pathology. Atherosclerosis in the thoracic aorta. IMPRESSION: 1. Probable mild small to moderate right subpulmonic pleural effusion. 2. Diffuse peribronchial cuffing, concerning for an acute bronchitis. 3. Aortic atherosclerosis. 4. Emphysema. Electronically Signed   By: Trudie Reedaniel  Entrikin M.D.   On: 12/18/2015 13:51     --Wells Guileseep Jasmeet Manton, M.D.   12/19/2015   Critical Care Attestation.  I have personally obtained a history, examined the patient, evaluated laboratory and imaging results, formulated the assessment and plan and placed orders. The Patient requires high complexity decision making for assessment and support, frequent evaluation and titration of therapies, application of advanced monitoring technologies and extensive interpretation of multiple databases. The patient has critical illness that could lead imminently to failure of 1 or more organ systems and requires the highest level of physician preparedness to intervene.  Critical Care Time devoted to patient care services described in this note is 45 minutes and is exclusive of time spent in procedures.

## 2015-12-19 NOTE — Progress Notes (Signed)
The Chaplain rounded the unit to provide a compassionate presence and support to the patient and family. Patient appeared to be sleeping.  However opened her eyes at the end of the prayer.  [phone]

## 2015-12-19 NOTE — Progress Notes (Signed)
Inpatient Diabetes Program Recommendations  AACE/ADA: New Consensus Statement on Inpatient Glycemic Control (2015)  Target Ranges:  Prepandial:   less than 140 mg/dL      Peak postprandial:   less than 180 mg/dL (1-2 hours)      Critically ill patients:  140 - 180 mg/dL   Results for Claudia Mercado, Claudia Mercado (MRN [id number]) as of 12/19/2015 10:16  Ref. Range 12/18/2015 12:56 12/19/2015 04:07  Glucose Latest Ref Range: 65 - 99 mg/dL 308190 (H) 657205 (H)   Review of Glycemic Control  Diabetes history: No Outpatient Diabetes medications: NA Current orders for Inpatient glycemic control: None  Inpatient Diabetes Program Recommendations: Correction (SSI): While ordered steroids, please consider ordering CBGs with Novolog sensitive correction scale Q4H.  NOTE: Patient has no documented history of DM. Per H&P, patient was given Solumedrol by EMS prior to arrival and is currently ordered Solumedrol 60 mg Q8H which is likely cause of hyperglycemia.   Thanks, Orlando PennerMarie Meesha Sek, RN, MSN, CDE Diabetes Coordinator Inpatient Diabetes Program [phone] (Team Pager from 8am to 5pm)

## 2015-12-19 NOTE — Progress Notes (Signed)
Dr. Nicholos Johnsamachandran present and gave order for precedex drip.

## 2015-12-20 ENCOUNTER — Inpatient Hospital Stay: Payer: Medicare Other

## 2015-12-20 DIAGNOSIS — Z9911 Dependence on respirator [ventilator] status: Secondary | ICD-10-CM

## 2015-12-20 DIAGNOSIS — J209 Acute bronchitis, unspecified: Secondary | ICD-10-CM

## 2015-12-20 LAB — CBC
HCT: 43.1 % (ref 35.0–47.0)
Hemoglobin: 14.4 g/dL (ref 12.0–16.0)
MCH: 32.2 pg (ref 26.0–34.0)
MCHC: 33.4 g/dL (ref 32.0–36.0)
MCV: 96.5 fL (ref 80.0–100.0)
PLATELETS: 262 10*3/uL (ref 150–440)
RBC: 4.46 MIL/uL (ref 3.80–5.20)
RDW: 12.8 % (ref 11.5–14.5)
WBC: 12.7 10*3/uL — AB (ref 3.6–11.0)

## 2015-12-20 LAB — BASIC METABOLIC PANEL
ANION GAP: 9 (ref 5–15)
BUN: 37 mg/dL — ABNORMAL HIGH (ref 6–20)
CALCIUM: 9.5 mg/dL (ref 8.9–10.3)
CO2: 40 mmol/L — ABNORMAL HIGH (ref 22–32)
Chloride: 95 mmol/L — ABNORMAL LOW (ref 101–111)
Creatinine, Ser: 0.47 mg/dL (ref 0.44–1.00)
GLUCOSE: 210 mg/dL — AB (ref 65–99)
POTASSIUM: 3.5 mmol/L (ref 3.5–5.1)
Sodium: 144 mmol/L (ref 135–145)

## 2015-12-20 LAB — MAGNESIUM
MAGNESIUM: 2.3 mg/dL (ref 1.7–2.4)
Magnesium: 2.5 mg/dL — ABNORMAL HIGH (ref 1.7–2.4)

## 2015-12-20 LAB — APTT: APTT: 28 s (ref 24–36)

## 2015-12-20 LAB — EXPECTORATED SPUTUM ASSESSMENT W GRAM STAIN, RFLX TO RESP C

## 2015-12-20 LAB — URINALYSIS, COMPLETE (UACMP) WITH MICROSCOPIC
BACTERIA UA: NONE SEEN
Specific Gravity, Urine: 1.028 (ref 1.005–1.030)

## 2015-12-20 LAB — PHOSPHORUS
PHOSPHORUS: 2.4 mg/dL — AB (ref 2.5–4.6)
PHOSPHORUS: 4 mg/dL (ref 2.5–4.6)

## 2015-12-20 LAB — GLUCOSE, CAPILLARY
GLUCOSE-CAPILLARY: 147 mg/dL — AB (ref 65–99)
GLUCOSE-CAPILLARY: 172 mg/dL — AB (ref 65–99)
GLUCOSE-CAPILLARY: 174 mg/dL — AB (ref 65–99)
GLUCOSE-CAPILLARY: 213 mg/dL — AB (ref 65–99)
Glucose-Capillary: 190 mg/dL — ABNORMAL HIGH (ref 65–99)
Glucose-Capillary: 164 mg/dL — ABNORMAL HIGH (ref 65–99)

## 2015-12-20 LAB — PROTIME-INR
INR: 1.05
PROTHROMBIN TIME: 13.7 s (ref 11.4–15.2)

## 2015-12-20 LAB — EXPECTORATED SPUTUM ASSESSMENT W REFEX TO RESP CULTURE

## 2015-12-20 LAB — AST: AST: 27 U/L (ref 15–41)

## 2015-12-20 LAB — PROCALCITONIN: PROCALCITONIN: 0.11 ng/mL

## 2015-12-20 LAB — ALT: ALT: 16 U/L (ref 14–54)

## 2015-12-20 MED ORDER — HALOPERIDOL LACTATE 5 MG/ML IJ SOLN
2.5000 mg | Freq: Once | INTRAMUSCULAR | Status: AC
  Administered 2015-12-20: 2.5 mg via INTRAVENOUS

## 2015-12-20 MED ORDER — MIDAZOLAM HCL 2 MG/2ML IJ SOLN
4.0000 mg | Freq: Once | INTRAMUSCULAR | Status: AC
  Administered 2015-12-20: 2 mg via INTRAVENOUS

## 2015-12-20 MED ORDER — FENTANYL BOLUS VIA INFUSION
25.0000 ug | INTRAVENOUS | Status: DC | PRN
  Administered 2015-12-21 – 2015-12-22 (×3): 25 ug via INTRAVENOUS
  Filled 2015-12-20: qty 25

## 2015-12-20 MED ORDER — SODIUM CHLORIDE 0.9 % IV SOLN
25.0000 ug/h | INTRAVENOUS | Status: DC
  Administered 2015-12-20 (×2): 400 ug/h via INTRAVENOUS
  Administered 2015-12-20: 30 ug/h via INTRAVENOUS
  Administered 2015-12-21: 35 ug/h via INTRAVENOUS
  Filled 2015-12-20 (×5): qty 50

## 2015-12-20 MED ORDER — VITAL HIGH PROTEIN PO LIQD
1000.0000 mL | ORAL | Status: DC
  Administered 2015-12-20 – 2015-12-22 (×3): 1000 mL

## 2015-12-20 MED ORDER — HALOPERIDOL LACTATE 5 MG/ML IJ SOLN
INTRAMUSCULAR | Status: AC
  Administered 2015-12-20: 2.5 mg via INTRAVENOUS
  Filled 2015-12-20: qty 1

## 2015-12-20 MED ORDER — VITAL HIGH PROTEIN PO LIQD
1000.0000 mL | ORAL | Status: DC
  Administered 2015-12-20: 1000 mL

## 2015-12-20 MED ORDER — MIDAZOLAM HCL 2 MG/2ML IJ SOLN
INTRAMUSCULAR | Status: AC
  Administered 2015-12-20: 2 mg via INTRAVENOUS
  Filled 2015-12-20: qty 4

## 2015-12-20 MED ORDER — PRO-STAT SUGAR FREE PO LIQD: 30.0000 mL | Freq: Two times a day (BID) | ORAL | Status: DC

## 2015-12-20 MED ORDER — MIDAZOLAM HCL 2 MG/2ML IJ SOLN
1.0000 mg | INTRAMUSCULAR | Status: DC | PRN
Start: 2015-12-20 — End: 2015-12-25
  Administered 2015-12-20 – 2015-12-23 (×9): 1 mg via INTRAVENOUS
  Filled 2015-12-20 (×7): qty 2

## 2015-12-20 MED ORDER — ROCURONIUM BROMIDE 50 MG/5ML IV SOLN
INTRAVENOUS | Status: AC
  Administered 2015-12-20: 50 mg via INTRAVENOUS
  Filled 2015-12-20: qty 1

## 2015-12-20 MED ORDER — FENTANYL CITRATE (PF) 100 MCG/2ML IJ SOLN
200.0000 ug | Freq: Once | INTRAMUSCULAR | Status: AC
  Administered 2015-12-20: 200 ug via INTRAVENOUS

## 2015-12-20 MED ORDER — FENTANYL CITRATE (PF) 100 MCG/2ML IJ SOLN
INTRAMUSCULAR | Status: AC
  Administered 2015-12-20: 200 ug via INTRAVENOUS
  Filled 2015-12-20: qty 4

## 2015-12-20 MED ORDER — MIDAZOLAM HCL 2 MG/2ML IJ SOLN
1.0000 mg | INTRAMUSCULAR | Status: AC | PRN
  Administered 2015-12-20 – 2015-12-21 (×3): 1 mg via INTRAVENOUS
  Filled 2015-12-20 (×5): qty 2

## 2015-12-20 MED ORDER — INSULIN ASPART 100 UNIT/ML ~~LOC~~ SOLN
0.0000 [IU] | SUBCUTANEOUS | Status: DC
  Administered 2015-12-20: 3 [IU] via SUBCUTANEOUS
  Administered 2015-12-20: 2 [IU] via SUBCUTANEOUS
  Administered 2015-12-20: 3 [IU] via SUBCUTANEOUS
  Administered 2015-12-21: 5 [IU] via SUBCUTANEOUS
  Administered 2015-12-21 (×2): 3 [IU] via SUBCUTANEOUS
  Administered 2015-12-21: 5 [IU] via SUBCUTANEOUS
  Administered 2015-12-21: 3 [IU] via SUBCUTANEOUS
  Administered 2015-12-21: 5 [IU] via SUBCUTANEOUS
  Administered 2015-12-22 (×2): 3 [IU] via SUBCUTANEOUS
  Administered 2015-12-22: 2 [IU] via SUBCUTANEOUS
  Administered 2015-12-22 (×3): 3 [IU] via SUBCUTANEOUS
  Administered 2015-12-23: 2 [IU] via SUBCUTANEOUS
  Administered 2015-12-23 (×4): 3 [IU] via SUBCUTANEOUS
  Administered 2015-12-23 – 2015-12-24 (×2): 2 [IU] via SUBCUTANEOUS
  Administered 2015-12-24: 3 [IU] via SUBCUTANEOUS
  Administered 2015-12-24: 2 [IU] via SUBCUTANEOUS
  Administered 2015-12-24: 3 [IU] via SUBCUTANEOUS
  Administered 2015-12-25: 2 [IU] via SUBCUTANEOUS
  Administered 2015-12-25 (×4): 3 [IU] via SUBCUTANEOUS
  Administered 2015-12-25: 2 [IU] via SUBCUTANEOUS
  Administered 2015-12-26: 3 [IU] via SUBCUTANEOUS
  Administered 2015-12-26: 5 [IU] via SUBCUTANEOUS
  Administered 2015-12-26: 3 [IU] via SUBCUTANEOUS
  Administered 2015-12-26 – 2015-12-27 (×2): 2 [IU] via SUBCUTANEOUS
  Administered 2015-12-27: 3 [IU] via SUBCUTANEOUS
  Administered 2015-12-27: 8 [IU] via SUBCUTANEOUS
  Administered 2015-12-27 (×2): 3 [IU] via SUBCUTANEOUS
  Administered 2015-12-27: 5 [IU] via SUBCUTANEOUS
  Administered 2015-12-28: 2 [IU] via SUBCUTANEOUS
  Administered 2015-12-28: 5 [IU] via SUBCUTANEOUS
  Administered 2015-12-28 (×2): 3 [IU] via SUBCUTANEOUS
  Filled 2015-12-20: qty 3
  Filled 2015-12-20: qty 8
  Filled 2015-12-20: qty 3
  Filled 2015-12-20: qty 2
  Filled 2015-12-20: qty 3
  Filled 2015-12-20: qty 5
  Filled 2015-12-20: qty 2
  Filled 2015-12-20 (×3): qty 3
  Filled 2015-12-20 (×2): qty 5
  Filled 2015-12-20 (×5): qty 3
  Filled 2015-12-20 (×3): qty 2
  Filled 2015-12-20 (×2): qty 3
  Filled 2015-12-20 (×2): qty 5
  Filled 2015-12-20 (×7): qty 3
  Filled 2015-12-20: qty 2
  Filled 2015-12-20: qty 5
  Filled 2015-12-20 (×5): qty 3
  Filled 2015-12-20 (×3): qty 2
  Filled 2015-12-20: qty 3
  Filled 2015-12-20: qty 2
  Filled 2015-12-20: qty 3
  Filled 2015-12-20: qty 2

## 2015-12-20 MED ORDER — ROCURONIUM BROMIDE 50 MG/5ML IV SOLN
50.0000 mg | Freq: Once | INTRAVENOUS | Status: AC
  Administered 2015-12-20: 50 mg via INTRAVENOUS

## 2015-12-20 MED ORDER — FENTANYL CITRATE (PF) 100 MCG/2ML IJ SOLN
50.0000 ug | Freq: Once | INTRAMUSCULAR | Status: DC
  Filled 2015-12-20: qty 2

## 2015-12-20 NOTE — Progress Notes (Signed)
Pharmacy Antibiotic Note  Isabel F Rami is a 75 y.o. female admiJosephina Shihtted on 12/18/2015 with COPD exacerbation, Respiratory distress/acutre respiratory failure, and possible PNA.    Pharmacy consulted for Ceftriaxone and Azithromycin dosing.  Plan: Will continue Ceftriaxone 1gm IV every 24 hours and Azithromycin 500mg  IV every 24 hours.   Height: 5\' 5"  (165.1 cm) Weight: 135 lb 9.3 oz (61.5 kg) IBW/kg (Calculated) : 57  Temp (24hrs), Avg:98.2 F (36.8 C), Min:98 F (36.7 C), Max:98.4 F (36.9 C)   Recent Labs Lab 12/18/15 1256 12/18/15 1307 12/19/15 0407 12/20/15 0332  WBC 13.8*  --  12.5* 12.7*  CREATININE 0.64  --  0.57 0.47  LATICACIDVEN  --  1.5  --   --     Estimated Creatinine Clearance: 53.8 mL/min (by C-G formula based on SCr of 0.47 mg/dL).    No Known Allergies  Antimicrobials this admission: 12/3 Ceftriaxone >>  12/3 Azithromycin >>   Dose adjustments this admission:   Microbiology results: 12/3 BCx: sent 12/3 MRSA PCR:   Thank you for allowing pharmacy to be a part of this patient's care.  Demetrius CharityJames,Olivia Royse D, PharmD, BCPS Clinical Pharmacist 12/20/2015 11:22 AM

## 2015-12-20 NOTE — Progress Notes (Signed)
Initial Nutrition Assessment  DOCUMENTATION CODES:   Not applicable  INTERVENTION:  -Dietitian Consult for TF received via initiation of Adult Tube Feeding Protocol. Recommend continuing Vital High Protein with new goal rate of 50 ml/hr providing 1200 kcals, 106 g protein and 1008 mL of free water. Continue PEPuP.  -Recommend addition of sliding scale insulin coverage  NUTRITION DIAGNOSIS:   Inadequate oral intake related to acute illness as evidenced by NPO status.  GOAL:   Provide needs based on ASPEN/SCCM guidelines  MONITOR:   Vent status, Labs, Weight trends, TF tolerance  REASON FOR ASSESSMENT:   Consult Enteral/tube feeding initiation and management  ASSESSMENT:   75 yo female admitted with acute on chronic respiratory failure from acute COPD exacerbation  Patient is currently intubated on ventilator support, pt with thick purulent secretions MV: 7.1 L/min Temp (24hrs), Avg:98.2 F (36.8 C), Min:98 F (36.7 C), Max:98.4 F (36.9 C)  OG tube with tip in stomach  Labs: phosphorus 2.4; FSBS 190s-200s Meds: solumedrol, fentanyl, versed  Diet Order:  Diet NPO time specified  Skin:  Reviewed, no issues  Last BM:  no BM  Height:   Ht Readings from Last 1 Encounters:  12/18/15 5\' 5"  (1.651 m)    Weight:   Wt Readings from Last 1 Encounters:  12/20/15 135 lb 9.3 oz (61.5 kg)   BMI:  Body mass index is 22.56 kg/m.  Estimated Nutritional Needs:   Kcal:  1215 kcals  Protein:  74-124 g   Fluid:  >/= 1.5 L  EDUCATION NEEDS:   No education needs identified at this time  Romelle StarcherCate Jamielynn Wigley MS, RD, LDN [phone] Pager  [phone] Weekend/On-Call Pager

## 2015-12-20 NOTE — Progress Notes (Signed)
Chaplain rounded the unit to provide a compassionate presence and support to the patient. Patient appeared to be sleeping.  No visitors were at the bedside. Chaplain provided silent prayer. Chaplain Brodric Schauer [phone] 

## 2015-12-20 NOTE — Progress Notes (Signed)
PULMONARY / CRITICAL CARE MEDICINE   Name: Claudia Mercado F Handrich MRN: [id number] DOB: 1941/01/12    ADMISSION DATE:  12/18/2015  75 yo white female smoker with acute on chronic resp hypoxic and hypercapnic failure from acute COPD exacerbation likely from viral bronchitis. Intubated 12/5 am.   PULMONARY A:  -Intubated on 12/5 after progressive respiratory failure on BiPAP. -Acute hypercapnic respiratory failure secondary to AECOPD -Blood gas 7.48/63/74/29, consistent with hypercapnic respiratory failure, metabolic alkalosis. P: Full vent support-we will decrease tidal volume and/or rate. Aggressive BD therapy Continue IV steroids Maintain O2 sats 88% to 92%  CARDIOVASCULAR A:  Hypertension P: Continuous telemetry monitoring Prn Hydralazine for systolic >160 or diastolic >100  RENAL A:  Hematuria P: Irrigate foley prn  Trend CBC's  Trend BMP's Replace electrolytes as indicated Monitor UOP  GASTROINTESTINAL A: No acute issues P: Pepcid for PUD prophylaxis  Start tube feeds  HEMATOLOGIC A:  No acute issues P: Trend CBC's Subq lovenox for VTE prophylaxis Monitor s/sx of bleeding Transfuse for hgb <7  INFECTIOUS A:  Blood culture positive for staph species  Leukocytosis P: Continue empiric abx Trend WBC's and monitor fever curve Trend PCT's Follow cultures   Antibiotics Ceftriaxone 12/3>> Azithromycin 12/3>>  Micro: Influenza 12/3: Negative Blood cultures 12/3: Negative thus far; positive for staph species  NEUROLOGIC A: Metabolic encephalopathy likely secondary to hypercapnia P: Maintain RASS goal 0 to -1 Fentanyl gtt, prn versed and fentanyl to maintain RASS goal WUA daily  Lights on during the day  FAMILY:--  ---------------------------------------------------  SUBJECTIVE: Pt intubated overnight, now on vent, sedated, appears to be breathing comfortably.   REVIEW OF SYSTEMS: Unable to obtain pt intubated  SIGNIFICANT EVENTS: 12/5-Pt  intubated due to severe respiratory distress  VITAL SIGNS: Temp:  [98 F (36.7 C)-98.3 F (36.8 C)] 98 F (36.7 C) (12/04 1930) Pulse Rate:  [71-107] 72 (12/05 0100) Resp:  [14-35] 27 (12/05 0100) BP: (113-173)/(63-99) 167/99 (12/05 0100) SpO2:  [92 %-98 %] 98 % (12/05 0155) FiO2 (%):  [35 %-36 %] 35 % (12/05 0155) Weight:  [139 lb 15.9 oz (63.5 kg)] 139 lb 15.9 oz (63.5 kg) (12/04 0510) HEMODYNAMICS:   PHYSICAL EXAMINATION:  GENERAL:critically ill frail appearing, +resp distress HEAD: Normocephalic, atraumatic.  EYES: Pupils equal, round, reactive to light.  No scleral icterus.  MOUTH: dry mucosal membrane. NECK: Supple, no JVD PULMONARY: +rhonchi, +wheezing, even, non labored  CARDIOVASCULAR: NSR, s1s2, no M/R/G GASTROINTESTINAL: +BS x4, soft, nontender, non distended  MUSCULOSKELETAL: No swelling, clubbing, or edema.  NEUROLOGIC: sedated, not following commands SKIN: intact,warm,dry  LABS:  CBC  Recent Labs Lab 12/18/15 1256 12/19/15 0407  WBC 13.8* 12.5*  HGB 14.9 14.0  HCT 45.6 42.5  PLT 318 278   Coag's  Recent Labs Lab 12/18/15 1256  INR 1.04   BMET  Recent Labs Lab 12/18/15 1256 12/19/15 0407  NA 142 141  K 3.9 3.7  CL 92* 92*  CO2 41* 41*  BUN 25* 32*  CREATININE 0.64 0.57  GLUCOSE 190* 205*   Electrolytes  Recent Labs Lab 12/18/15 1256 12/19/15 0407  CALCIUM 9.8 9.4   Sepsis Markers  Recent Labs Lab 12/18/15 1307 12/19/15 0407  LATICACIDVEN 1.5  --   PROCALCITON  --  <0.10   ABG  Recent Labs Lab 12/18/15 1951 12/19/15 0347 12/20/15 0157  PHART 7.39 7.39 7.48*  PCO2ART 75* 82* 65*  PO2ART 58* 86 102   Liver Enzymes  Recent Labs Lab 12/18/15 1256  AST 29  ALT  15  ALKPHOS 97  BILITOT 0.5  ALBUMIN 3.8   Cardiac Enzymes No results for input(s): TROPONINI, PROBNP in the last 168 hours. Glucose  Recent Labs Lab 12/18/15 1545  GLUCAP 183*    Imaging Dg Chest Port 1 View  Result Date:  12/19/2015 CLINICAL DATA:  75 year old female with recent respiratory distress. COPD. Initial encounter. EXAM: PORTABLE CHEST 1 VIEW COMPARISON:  12/18/2015. FINDINGS: Portable AP upright view at 0528 hours. Mildly more kyphotic positioning the knee yesterday. Stable cardiac size and mediastinal contours. Stable or perhaps mildly regressed pulmonary interstitial opacity with no pneumothorax. Stable blunting of the right lung base. No other confluent pulmonary opacity. No left pleural effusion suspected. IMPRESSION: Stable chest. Continued opacity at the right lung base which could reflect small pleural effusion and/or airspace disease, and suspected underlying chronic lung disease. Electronically Signed   By: Odessa FlemingH  Hall M.D.   On: 12/19/2015 07:38   -Wells Guileseep Juanangel Soderholm, M.D.  12/20/2015   Critical Care Attestation.  I have personally obtained a history, examined the patient, evaluated laboratory and imaging results, formulated the assessment and plan and placed orders. The Patient requires high complexity decision making for assessment and support, frequent evaluation and titration of therapies, application of advanced monitoring technologies and extensive interpretation of multiple databases. The patient has critical illness that could lead imminently to failure of 1 or more organ systems and requires the highest level of physician preparedness to intervene.  Critical Care Time devoted to patient care services described in this note is 45 minutes and is exclusive of time spent in procedures.

## 2015-12-20 NOTE — Progress Notes (Signed)
Inpatient Diabetes Program Recommendations  AACE/ADA: New Consensus Statement on Inpatient Glycemic Control (2015)  Target Ranges:  Prepandial:   less than 140 mg/dL      Peak postprandial:   less than 180 mg/dL (1-2 hours)      Critically ill patients:  140 - 180 mg/dL   Results for Josephina ShihCRAWFORD, Cashae F (MRN [id number]) as of 12/20/2015 07:39  Ref. Range 12/18/2015 15:45 12/20/2015 04:03 12/20/2015 07:32  Glucose-Capillary Latest Ref Range: 65 - 99 mg/dL 409183 (H) 811213 (H) 914190 (H)   Review of Glycemic Control  Diabetes history: No Outpatient Diabetes medications: NA Current orders for Inpatient glycemic control: None  Inpatient Diabetes Program Recommendations: Correction (SSI): While ordered steroids, please consider ordering CBGs with Novolog sensitive correction scale Q4H.  Thanks, Orlando PennerMarie Baron Parmelee, RN, MSN, CDE Diabetes Coordinator Inpatient Diabetes Program [phone] (Team Pager from 8am to 5pm)

## 2015-12-20 NOTE — Procedures (Signed)
Endotracheal Intubation Procedure Note  Indication for endotracheal intubation: respiratory failure. Airway Assessment: Mallampati Class: II (hard and soft palate, upper portion of tonsils anduvula visible). Sedation: fentanyl, midazolam and rocuronium. Paralytic: none. Lidocaine: no. Atropine: no. Equipment: Macintosh 1 laryngoscope blade and 7.145mm cuffed endotracheal tube. Cricoid Pressure: yes. Number of attempts: 1. ETT location confirmed by by auscultation, by CXR and ETCO2 monitor.  Pt intubated by respiratory therapist Frita.  Eugenie NorrieDana G Blakeney 12/20/2015

## 2015-12-21 ENCOUNTER — Inpatient Hospital Stay: Payer: Medicare Other

## 2015-12-21 LAB — CBC
HEMATOCRIT: 40.9 % (ref 35.0–47.0)
Hemoglobin: 13.4 g/dL (ref 12.0–16.0)
MCH: 32 pg (ref 26.0–34.0)
MCHC: 32.7 g/dL (ref 32.0–36.0)
MCV: 97.7 fL (ref 80.0–100.0)
Platelets: 290 10*3/uL (ref 150–440)
RBC: 4.19 MIL/uL (ref 3.80–5.20)
RDW: 13.7 % (ref 11.5–14.5)
WBC: 15.5 10*3/uL — AB (ref 3.6–11.0)

## 2015-12-21 LAB — BLOOD GAS, ARTERIAL
ACID-BASE EXCESS: 14.8 mmol/L — AB (ref 0.0–2.0)
BICARBONATE: 47.5 mmol/L — AB (ref 20.0–28.0)
FIO2: 0.5
O2 SAT: 97.4 %
PCO2 ART: 101 mmHg — AB (ref 32.0–48.0)
PEEP/CPAP: 5 cmH2O
PH ART: 7.28 — AB (ref 7.350–7.450)
Patient temperature: 37
RATE: 12 resp/min
VT: 450 mL
pO2, Arterial: 105 mmHg (ref 83.0–108.0)

## 2015-12-21 LAB — BASIC METABOLIC PANEL
ANION GAP: 6 (ref 5–15)
BUN: 49 mg/dL — ABNORMAL HIGH (ref 6–20)
CALCIUM: 9 mg/dL (ref 8.9–10.3)
CO2: 42 mmol/L — AB (ref 22–32)
Chloride: 97 mmol/L — ABNORMAL LOW (ref 101–111)
Creatinine, Ser: 0.71 mg/dL (ref 0.44–1.00)
GLUCOSE: 237 mg/dL — AB (ref 65–99)
POTASSIUM: 4.3 mmol/L (ref 3.5–5.1)
Sodium: 145 mmol/L (ref 135–145)

## 2015-12-21 LAB — PHOSPHORUS
PHOSPHORUS: 4.5 mg/dL (ref 2.5–4.6)
PHOSPHORUS: 2.8 mg/dL (ref 2.5–4.6)

## 2015-12-21 LAB — MAGNESIUM
Magnesium: 2.4 mg/dL (ref 1.7–2.4)
Magnesium: 2.4 mg/dL (ref 1.7–2.4)

## 2015-12-21 LAB — CULTURE, BLOOD (ROUTINE X 2)

## 2015-12-21 LAB — URINE CULTURE: CULTURE: NO GROWTH

## 2015-12-21 LAB — GLUCOSE, CAPILLARY
GLUCOSE-CAPILLARY: 201 mg/dL — AB (ref 65–99)
GLUCOSE-CAPILLARY: 188 mg/dL — AB (ref 65–99)
GLUCOSE-CAPILLARY: 180 mg/dL — AB (ref 65–99)
Glucose-Capillary: 237 mg/dL — ABNORMAL HIGH (ref 65–99)
Glucose-Capillary: 248 mg/dL — ABNORMAL HIGH (ref 65–99)

## 2015-12-21 LAB — TRIGLYCERIDES: TRIGLYCERIDES: 90 mg/dL (ref <150)

## 2015-12-21 LAB — PROCALCITONIN: Procalcitonin: <0.1 ng/mL

## 2015-12-21 MED ORDER — SODIUM CHLORIDE 0.9 % IV BOLUS (SEPSIS)
250.0000 mL | Freq: Once | INTRAVENOUS | Status: AC
  Administered 2015-12-21: 250 mL via INTRAVENOUS

## 2015-12-21 MED ORDER — PROPOFOL 1000 MG/100ML IV EMUL
5.0000 ug/kg/min | INTRAVENOUS | Status: DC
  Administered 2015-12-21: 5 ug/kg/min via INTRAVENOUS
  Administered 2015-12-22 (×2): 45 ug/kg/min via INTRAVENOUS
  Filled 2015-12-21 (×4): qty 100

## 2015-12-21 MED ORDER — METHYLPREDNISOLONE SODIUM SUCC 40 MG IJ SOLR
40.0000 mg | Freq: Three times a day (TID) | INTRAMUSCULAR | Status: DC
  Administered 2015-12-21 – 2015-12-23 (×7): 40 mg via INTRAVENOUS
  Filled 2015-12-21 (×7): qty 1

## 2015-12-21 MED ORDER — FENTANYL 2500MCG IN NS 250ML (10MCG/ML) PREMIX INFUSION
25.0000 ug/h | INTRAVENOUS | Status: DC
  Administered 2015-12-21: 50 ug/h via INTRAVENOUS
  Filled 2015-12-21: qty 250

## 2015-12-21 MED ORDER — FENTANYL CITRATE (PF) 100 MCG/2ML IJ SOLN
100.0000 ug | Freq: Once | INTRAMUSCULAR | Status: AC
Start: 2015-12-21 — End: 2015-12-21
  Administered 2015-12-21: 100 ug via INTRAVENOUS
  Filled 2015-12-21: qty 2

## 2015-12-21 NOTE — Progress Notes (Signed)
Inpatient Diabetes Program Recommendations  AACE/ADA: New Consensus Statement on Inpatient Glycemic Control (2015)  Target Ranges:  Prepandial:   less than 140 mg/dL      Peak postprandial:   less than 180 mg/dL (1-2 hours)      Critically ill patients:  140 - 180 mg/dL   Lab Results  Component Value Date   GLUCAP 201 (H) 12/21/2015    Review of Glycemic Control: Results for Claudia ShihCRAWFORD, Leanora F (MRN [id number]) as of 12/21/2015 13:30  Ref. Range 12/20/2015 19:36 12/20/2015 23:45 12/21/2015 03:36 12/21/2015 07:23 12/21/2015 11:52  Glucose-Capillary Latest Ref Range: 65 - 99 mg/dL 308164 (H) 657174 (H) 846237 (H) 248 (H) 201 (H)     May consider ICU Glycemic control order set, standard correction q 4 hours.    Thanks, Beryl MeagerJenny Stephenia Vogan, RN, BC-ADM Inpatient Diabetes Coordinator Pager [phone] (8a-5p)

## 2015-12-21 NOTE — Progress Notes (Signed)
PULMONARY / CRITICAL CARE MEDICINE   Name: Claudia Mercado MRN: [name] DOB: 08/27/1940    ADMISSION DATE:  12/18/2015 CONSULTATION DATE:  12/18/15   Discussion: 75 yo white female smoker with acute on chronic resp hypoxic and hypercapnic failure from acute COPD exacerbation likely from viral bronchitis. Intubated 12/5 am.  ASSESSMENT / PLAN:  PULMONARY A:  -Intubated on 12/5 after progressive respiratory failure on BiPAP. 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 Minimally changed from previous.  P:  Will increase Rate to 14 Aggressive BD therapy Continue IV steroids Maintain O2 sats 88% to 92%  CARDIOVASCULAR A:  Hypertension P: Continuous telemetry monitoring Prn Hydralazine for systolic >160 or diastolic >100  RENAL A:  Hematuria- improving P: Irrigate foley prn  Trend CBC's  Trend BMP's Replace electrolytes as indicated Monitor UOP  GASTROINTESTINAL A: No acute issues P: Pepcid for PUD prophylaxis  Start tube feeds  HEMATOLOGIC A:  No acute issues P: Trend CBC's Subq lovenox for VTE prophylaxis Monitor s/sx of bleeding Transfuse for hgb <7  INFECTIOUS A:  Blood culture positive for staph species  Leukocytosis  P: Continue empiric abx Trend WBC's and monitor fever curve Trend PCT's Follow cultures   CULTURES: Influenza 12/3: Negative Blood cultures 12/3: Negative thus far; positive for staph species Sputum cultures 12/5; negative thus far.   ANTIBIOTICS: Ceftriaxone 12/3>> Azithromycin 12/3>>  NEUROLOGIC A: Metabolic encephalopathy likely secondary to hypercapnia P: Maintain RASS goal 0 to -1 Fentanyl gtt, prn versed and fentanyl to maintain RASS goal WUA daily  Lights on during  the day   STUDIES:  12/5 Renal ultrasound>> Unremarkable   SIGNIFICANT EVENTS: 12/5 Patient emergently intubated for acute Respiratory Failure  LINES/TUBES: 12/20/15 ET tube>>      -------------------------------------------------------------------------------   SUBJECTIVE:  Patient continues to be sedated on ventilator, does not follow any commands.  No purposeful movements.  VITAL SIGNS: BP 110/63   Pulse (!) 105   Temp 97.9 F (36.6 C) (Axillary)   Resp 13   Ht 5\' 5"  (1.651 m)   Wt 58.5 kg (128 lb 15.5 oz)   SpO2 92%   BMI 21.46 kg/m   HEMODYNAMICS:    VENTILATOR SETTINGS: Vent Mode: PRVC FiO2 (%):  [40 %-50 %] 40 % Set Rate:  [12 bmp-16 bmp] 16 bmp Vt Set:  [450 mL] 450 mL PEEP:  [5 cmH20] 5 cmH20 Plateau Pressure:  [17 cmH20] 17 cmH20  INTAKE / OUTPUT: I/O last 3 completed shifts: In: 1186.5 [I.V.:611.5; NG/GT:575] Out: 1050 [Urine:1050]  PHYSICAL EXAMINATION: General:  Elderly, sickly appearing white female, intubated and on mechanical ventilation Neuro:  obtunded HEENT:  Atraumatic, normocephalic, Pinpoint pupils, sluggishly reacting Cardiovascular:  S1s2, tachycardia, no MRG noted Lungs: diminished right lower, no crackles, rhonchi noted Abdomen: BS+ve, no guarding noted Musculoskeletal:  No obvious inflammation/deformity noted Skin:  Grossly intact  LABS:  BMET  Recent Labs Lab 12/19/15 0407 12/20/15 0332 12/21/15 0330  NA 141 144 145  K 3.7 3.5 4.3  CL 92* 95* 97*  CO2 41* 40* 42*  BUN 32* 37* 49*  CREATININE 0.57 0.47 0.71  GLUCOSE 205* 210* 237*    Electrolytes  Recent Labs Lab 12/19/15 0407 12/20/15 0332 12/20/15 1640 12/21/15 0330  CALCIUM 9.4 9.5  --  9.0  MG  --  2.3 2.5* 2.4  PHOS  --  2.4* 4.0 4.5  CBC  Recent Labs Lab 12/19/15 0407 12/20/15 0332 12/21/15 0330  WBC 12.5* 12.7* 15.5*  HGB 14.0 14.4 13.4  HCT 42.5 43.1 40.9  PLT 278 262 290    Coag's  Recent Labs Lab 12/18/15 1256  12/20/15 0815  APTT  --  28  INR 1.04 1.05    Sepsis Markers  Recent Labs Lab 12/18/15 1307 12/19/15 0407 12/20/15 0332 12/21/15 0330  LATICACIDVEN 1.5  --   --   --   PROCALCITON  --  <0.10 0.11 <0.10    ABG  Recent Labs Lab 12/20/15 0157 12/20/15 0500 12/21/15 0433  PHART 7.48* 7.48* 7.28*  PCO2ART 65* 63* 101*  PO2ART 102 74* 105    Liver Enzymes  Recent Labs Lab 12/18/15 1256 12/20/15 0815  AST 29 27  ALT 15 16  ALKPHOS 97  --   BILITOT 0.5  --   ALBUMIN 3.8  --     Cardiac Enzymes No results for input(s): TROPONINI, PROBNP in the last 168 hours.  Glucose  Recent Labs Lab 12/20/15 0732 12/20/15 1113 12/20/15 1614 12/20/15 1936 12/20/15 2345 12/21/15 0336  GLUCAP 190* 147* 172* 164* 174* 237*    Imaging [place] Renal  Result Date: 12/20/2015 CLINICAL DATA:  Gross hematuria. EXAM: RENAL / URINARY TRACT ULTRASOUND COMPLETE COMPARISON:  12/20/2015. FINDINGS: Right Kidney: Length: 10.8 cm. Irregular contour consistent with scarring.Echogenicity within normal limits. No mass or hydronephrosis visualized. Left Kidney: Length: 11.5 cm. Irregular contour consistent with scarring. Echogenicity within normal limits. No mass or hydronephrosis visualized. Bladder: Not visualized. Patient's Foley catheter. Exam limited due to patient breathing. IMPRESSION: 1.  Irregular renal contour suggesting scarring. 2.  Exam otherwise unremarkable. Electronically Signed   By: Claudia Mercado  Mercado   On: 12/20/2015 10:07     Deep Claudia Mercado, M.D.  Pulmonary and Critical Care Medicine [facility]   12/21/2015, 6:01 AM   Critical Care Attestation.  I have personally obtained a history, examined the patient, evaluated laboratory and imaging results, formulated the assessment and plan and placed orders. The Patient requires high complexity decision making for assessment and support, frequent evaluation and titration of therapies, application of advanced monitoring  technologies and extensive interpretation of multiple databases. The patient has critical illness that could lead imminently to failure of 1 or more organ systems and requires the highest level of physician preparedness to intervene.  Critical Care Time devoted to patient care services described in this note is 45 minutes and is exclusive of time spent in procedures.

## 2015-12-22 ENCOUNTER — Inpatient Hospital Stay: Payer: Medicare Other

## 2015-12-22 LAB — BASIC METABOLIC PANEL
ANION GAP: 5 (ref 5–15)
BUN: 39 mg/dL — ABNORMAL HIGH (ref 6–20)
CALCIUM: 9.1 mg/dL (ref 8.9–10.3)
CO2: 43 mmol/L — AB (ref 22–32)
Chloride: 98 mmol/L — ABNORMAL LOW (ref 101–111)
Creatinine, Ser: 0.59 mg/dL (ref 0.44–1.00)
Glucose, Bld: 215 mg/dL — ABNORMAL HIGH (ref 65–99)
POTASSIUM: 4.1 mmol/L (ref 3.5–5.1)
Sodium: 146 mmol/L — ABNORMAL HIGH (ref 135–145)

## 2015-12-22 LAB — CBC
HEMATOCRIT: 42.3 % (ref 35.0–47.0)
Hemoglobin: 13.8 g/dL (ref 12.0–16.0)
MCH: 31.4 pg (ref 26.0–34.0)
MCHC: 32.6 g/dL (ref 32.0–36.0)
MCV: 96.3 fL (ref 80.0–100.0)
Platelets: 260 10*3/uL (ref 150–440)
RBC: 4.39 MIL/uL (ref 3.80–5.20)
RDW: 13.3 % (ref 11.5–14.5)
WBC: 14.5 10*3/uL — AB (ref 3.6–11.0)

## 2015-12-22 LAB — GLUCOSE, CAPILLARY
GLUCOSE-CAPILLARY: 147 mg/dL — AB (ref 65–99)
GLUCOSE-CAPILLARY: 178 mg/dL — AB (ref 65–99)
GLUCOSE-CAPILLARY: 182 mg/dL — AB (ref 65–99)
Glucose-Capillary: 148 mg/dL — ABNORMAL HIGH (ref 65–99)
Glucose-Capillary: 166 mg/dL — ABNORMAL HIGH (ref 65–99)
Glucose-Capillary: 179 mg/dL — ABNORMAL HIGH (ref 65–99)
Glucose-Capillary: 182 mg/dL — ABNORMAL HIGH (ref 65–99)
Glucose-Capillary: 212 mg/dL — ABNORMAL HIGH (ref 65–99)

## 2015-12-22 MED ORDER — DEXMEDETOMIDINE HCL IN NACL 400 MCG/100ML IV SOLN
0.4000 ug/kg/h | INTRAVENOUS | Status: DC
  Administered 2015-12-22: 0.4 ug/kg/h via INTRAVENOUS
  Administered 2015-12-22: 1.2 ug/kg/h via INTRAVENOUS
  Administered 2015-12-22 – 2015-12-23 (×2): 0.6 ug/kg/h via INTRAVENOUS
  Administered 2015-12-23: 1.2 ug/kg/h via INTRAVENOUS
  Administered 2015-12-23: 1 ug/kg/h via INTRAVENOUS
  Administered 2015-12-24: 1.2 ug/kg/h via INTRAVENOUS
  Administered 2015-12-24: 0.5 ug/kg/h via INTRAVENOUS
  Administered 2015-12-24: 1.2 ug/kg/h via INTRAVENOUS
  Filled 2015-12-22 (×9): qty 100

## 2015-12-22 MED ORDER — ORAL CARE MOUTH RINSE
15.0000 mL | Freq: Four times a day (QID) | OROMUCOSAL | Status: DC
  Administered 2015-12-22 – 2015-12-23 (×5): 15 mL via OROMUCOSAL

## 2015-12-22 MED ORDER — CHLORHEXIDINE GLUCONATE 0.12% ORAL RINSE (MEDLINE KIT)
15.0000 mL | Freq: Two times a day (BID) | OROMUCOSAL | Status: AC
  Administered 2015-12-22 – 2015-12-23 (×3): 15 mL via OROMUCOSAL

## 2015-12-22 NOTE — Progress Notes (Signed)
Pharmacy Antibiotic Note  Claudia Mercado Mascio is a 75 y.o. female admitted on 12/18/2015 with COPD exacerbation, Respiratory distress/acutre respiratory failure, and possible PNA.    Pharmacy consulted for Ceftriaxone and Azithromycin dosing.  Plan: Will continue Ceftriaxone 1gm IV every 24 hours and Azithromycin 500mg  IV every 24 hours.   Height: 5\' 5"  (165.1 cm) Weight: 132 lb 0.9 oz (59.9 kg) IBW/kg (Calculated) : 57  Temp (24hrs), Avg:98.6 Mercado (37 C), Min:98.1 Mercado (36.7 C), Max:99.4 Mercado (37.4 C)   Recent Labs Lab 12/18/15 1256 12/18/15 1307 12/19/15 0407 12/20/15 0332 12/21/15 0330 12/22/15 0501  WBC 13.8*  --  12.5* 12.7* 15.5* 14.5*  CREATININE 0.64  --  0.57 0.47 0.71 0.59  LATICACIDVEN  --  1.5  --   --   --   --     Estimated Creatinine Clearance: 53.8 mL/min (by C-G formula based on SCr of 0.59 mg/dL).    No Known Allergies  Antimicrobials this admission: 12/3 Ceftriaxone >>  12/3 Azithromycin >>   Dose adjustments this admission:   Microbiology results: 12/3 BCx: sent 12/3 MRSA PCR:   Thank you for allowing pharmacy to be a part of this patient's care.  Demetrius CharityJames,[place] Sitzmann D, PharmD, BCPS Clinical Pharmacist 12/22/2015 1:44 PM

## 2015-12-22 NOTE — Progress Notes (Signed)
PULMONARY / CRITICAL CARE MEDICINE   Name: Claudia Mercado F [place] MRN: [id number] DOB: 06/07/1940    ADMISSION DATE:  12/18/2015 CONSULTATION DATE:  12/18/15   Discussion: 75 yo white female smoker with acute on chronic resp hypoxic and hypercapnic failure from acute COPD exacerbation likely from viral bronchitis. Intubated 12/5 am.  ASSESSMENT / PLAN:  PULMONARY A:  -Intubated on 12/5 after progressive respiratory failure on BiPAP. 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 Minimally changed from previous.  P: Aggressive BD therapy Continue IV steroids Maintain O2 sats 88% to 92%  CARDIOVASCULAR A:  Hypertension P: Continuous telemetry monitoring Prn Hydralazine for systolic >160 or diastolic >100  RENAL A:  Hematuria- resolved P: Irrigate foley prn  Trend CBC's  Trend BMP's Replace electrolytes as indicated Monitor UOP  GASTROINTESTINAL A: No acute issues P: Pepcid for PUD prophylaxis  Start tube feeds  HEMATOLOGIC A:  No acute issues P: Trend CBC's Subq lovenox for VTE prophylaxis Monitor s/sx of bleeding Transfuse for hgb <7  INFECTIOUS A:  Blood culture positive for staph species  Leukocytosis-worsening P: Continue empiric abx Trend WBC's and monitor fever curve Follow cultures   CULTURES: Influenza 12/3: Negative Blood cultures 12/3: Negative thus far; positive for staph species Sputum cultures 12/5; negative thus far.   ANTIBIOTICS: Ceftriaxone 12/3>> Azithromycin 12/3>>  NEUROLOGIC A: Metabolic encephalopathy likely secondary to hypercapnia P: Maintain RASS goal 0 to -1 Fentanyl gtt, prn versed and fentanyl to maintain RASS goal WUA daily  Lights on during the day   STUDIES:  12/5 Renal ultrasound>>  Unremarkable   SIGNIFICANT EVENTS: 12/5 Patient emergently intubated for acute Respiratory Failure  LINES/TUBES: 12/20/15 ET tube>>  SUBJECTIVE:  Patient continues to be sedated on ventilator, does not follow any commands.  No purposeful movements.  VITAL SIGNS: BP (!) 99/57   Pulse 76   Temp 99.4 F (37.4 C) (Oral)   Resp 13   Ht 5\' 5"  (1.651 m)   Wt 59.9 kg (132 lb 0.9 oz)   SpO2 94%   BMI 21.98 kg/m   HEMODYNAMICS:    VENTILATOR SETTINGS: Vent Mode: PRVC FiO2 (%):  [40 %-50 %] 40 % Set Rate:  [12 bmp-16 bmp] 14 bmp Vt Set:  [450 mL] 450 mL PEEP:  [5 cmH20] 5 cmH20 Plateau Pressure:  [15 cmH20-18 cmH20] 18 cmH20  INTAKE / OUTPUT: I/O last 3 completed shifts: In: 2712.5 [I.V.:897.5; NG/GT:1765; IV Piggyback:50] Out: 1590 [Urine:1590]  PHYSICAL EXAMINATION: General:  Elderly, sickly appearing white female, intubated and on mechanical ventilation Neuro:  Opens eyes spontaneously, but does not follow any commands HEENT:  Atraumatic, normocephalic, PERRLA Cardiovascular:  S1s2, tachycardia, no MRG noted Lungs: diminished right lower, no crackles, rhonchi noted Abdomen: BS+ve, no guarding noted Musculoskeletal:  No obvious inflammation/deformity noted Skin:  Grossly intact  LABS:  BMET  Recent Labs Lab 12/19/15 0407 12/20/15 0332 12/21/15 0330  NA 141 144 145  K 3.7 3.5 4.3  CL 92* 95* 97*  CO2 41* 40* 42*  BUN 32* 37* 49*  CREATININE 0.57 0.47 0.71  GLUCOSE 205* 210* 237*    Electrolytes  Recent Labs Lab 12/19/15 0407  12/20/15 0332 12/20/15 1640 12/21/15 0330 12/21/15 1736  CALCIUM 9.4  --  9.5  --  9.0  --   MG  --   < > 2.3 2.5* 2.4 2.4  PHOS  --   < >  2.4* 4.0 4.5 2.8  < > = values in this interval not displayed.  CBC  Recent Labs Lab 12/19/15 0407 12/20/15 0332 12/21/15 0330  WBC 12.5* 12.7* 15.5*  HGB 14.0 14.4 13.4  HCT 42.5 43.1 40.9  PLT 278 262 290    Coag's  Recent Labs Lab 12/18/15 1256 12/20/15 0815  APTT   --  28  INR 1.04 1.05    Sepsis Markers  Recent Labs Lab 12/18/15 1307 12/19/15 0407 12/20/15 0332 12/21/15 0330  LATICACIDVEN 1.5  --   --   --   PROCALCITON  --  <0.10 0.11 <0.10    ABG  Recent Labs Lab 12/20/15 0157 12/20/15 0500 12/21/15 0433  PHART 7.48* 7.48* 7.28*  PCO2ART 65* 63* 101*  PO2ART 102 74* 105    Liver Enzymes  Recent Labs Lab 12/18/15 1256 12/20/15 0815  AST 29 27  ALT 15 16  ALKPHOS 97  --   BILITOT 0.5  --   ALBUMIN 3.8  --     Cardiac Enzymes No results for input(s): TROPONINI, PROBNP in the last 168 hours.  Glucose  Recent Labs Lab 12/21/15 0723 12/21/15 1152 12/21/15 1630 12/21/15 1951 12/22/15 0005 12/22/15 0343  GLUCAP 248* 201* 188* 180* 182* 166*    Imaging Dg Chest 1 View  Result Date: 12/21/2015 CLINICAL DATA:  Dyspnea EXAM: CHEST 1 VIEW COMPARISON:  12/20/2015 FINDINGS: Endotracheal tube and nasogastric catheter are noted in satisfactory position. Cardiac shadow is stable. Aortic calcifications are noted. Mild tortuosity of the thoracic aorta is again seen. Elevation the right hemidiaphragm is noted with mild right basilar atelectasis. No focal confluent infiltrate is seen. Emphysematous changes are noted on the left. IMPRESSION: Predominately chronic changes with the exception of right basilar atelectasis. Electronically Signed   By: Alcide CleverMark  Lukens M.D.   On: 12/21/2015 07:42     Keavon Sensing,AG-ACNP Pulmonary & Critical Care

## 2015-12-22 NOTE — Progress Notes (Signed)
Patient was extubated and placed on [place] 4L , Bipap set up in room for PRN and night time use.

## 2015-12-22 NOTE — Progress Notes (Signed)
RT to room to extubate patient per MD order.  Patient suctioned prior to extubation, extubated, placed on 4LPM  without complication.  Will continue to monitor.  Bipap ordered for PRN and QHS use setup at bedside.

## 2015-12-23 DIAGNOSIS — R410 Disorientation, unspecified: Secondary | ICD-10-CM

## 2015-12-23 LAB — GLUCOSE, CAPILLARY
Glucose-Capillary: 163 mg/dL — ABNORMAL HIGH (ref 65–99)
Glucose-Capillary: 161 mg/dL — ABNORMAL HIGH (ref 65–99)
Glucose-Capillary: 165 mg/dL — ABNORMAL HIGH (ref 65–99)
Glucose-Capillary: 128 mg/dL — ABNORMAL HIGH (ref 65–99)
Glucose-Capillary: 194 mg/dL — ABNORMAL HIGH (ref 65–99)

## 2015-12-23 LAB — CULTURE, BLOOD (ROUTINE X 2): Culture: NO GROWTH

## 2015-12-23 MED ORDER — POLYETHYLENE GLYCOL 3350 17 G PO PACK
17.0000 g | PACK | Freq: Every day | ORAL | Status: DC | PRN
  Administered 2015-12-23 – 2015-12-25 (×2): 17 g via ORAL
  Filled 2015-12-23 (×2): qty 1

## 2015-12-23 MED ORDER — ENSURE ENLIVE PO LIQD
237.0000 mL | Freq: Two times a day (BID) | ORAL | Status: DC
  Administered 2015-12-24 – 2015-12-28 (×6): 237 mL via ORAL

## 2015-12-23 MED ORDER — HYDRALAZINE HCL 20 MG/ML IJ SOLN
10.0000 mg | INTRAMUSCULAR | Status: DC | PRN
  Administered 2015-12-23: 10 mg via INTRAVENOUS
  Filled 2015-12-23: qty 1

## 2015-12-23 MED ORDER — ORAL CARE MOUTH RINSE
15.0000 mL | Freq: Two times a day (BID) | OROMUCOSAL | Status: DC
  Administered 2015-12-23: 15 mL via OROMUCOSAL

## 2015-12-23 MED ORDER — METHYLPREDNISOLONE SODIUM SUCC 40 MG IJ SOLR
40.0000 mg | Freq: Two times a day (BID) | INTRAMUSCULAR | Status: DC
  Administered 2015-12-24 – 2015-12-28 (×10): 40 mg via INTRAVENOUS
  Filled 2015-12-23 (×11): qty 1

## 2015-12-23 MED ORDER — QUETIAPINE FUMARATE ER 50 MG PO TB24
50.0000 mg | ORAL_TABLET | Freq: Every day | ORAL | Status: DC
  Administered 2015-12-23 – 2015-12-27 (×5): 50 mg via ORAL
  Filled 2015-12-23 (×5): qty 1

## 2015-12-23 NOTE — Plan of Care (Signed)
Problem: SLP Dysphagia Goals Goal: Misc Dysphagia Goal Pt will safely tolerate po diet of least restrictive consistency w/ no overt s/s of aspiration noted by Staff/pt/family x3 sessions.    

## 2015-12-23 NOTE — Progress Notes (Signed)
Chaplain rounded the unit to provide a compassionate presence and support to the patient and family. Patient appeared to be feeling better and said so.  Prayer was requested and said. Jefm PettyChaplain Jaxie Racanelli [phone]

## 2015-12-23 NOTE — Evaluation (Signed)
Clinical/Bedside Swallow Evaluation Patient Details  Name: Claudia Mercado MRN: [id number] Date of Birth: 1940/08/27  Today's Date: 12/23/2015 Time: SLP Start Time (ACUTE ONLY): 1300 SLP Stop Time (ACUTE ONLY): 1355 SLP Time Calculation (min) (ACUTE ONLY): 55 min  Past Medical History:  Past Medical History:  Diagnosis Date  . COPD (chronic obstructive pulmonary disease) (HCC)   . Elevated blood pressure reading    Past Surgical History: History reviewed. No pertinent surgical history. HPI:  Claudia Mercado is a 75 y.o. female with a history of COPD who was found by her sister reportedly slumped over and barely breathing. EMS stated that her O2 sat was 88% on maybe 3 L outpatient nasal cannula oxygen, but minimally responsive, extremely tight/not moving air, and end-tidal CO2 reading in the 80s. She was placed on BiPAP and given magnesium, 2 or 3 DuoNeb's, and Solu-Medrol. Due to Claudia Mercado's declining Pulmonary status(BiPAP not sufficient), Claudia Mercado was orally intubated now extubated. Claudia Mercado has required medications for increased agitation but appears calm at the time of this evaluation. She is talking w/ SLP and answering basic questions re: self; oriented to self/place. Speech clear; follows basic commands w/ few cues.    Assessment / Plan / Recommendation Clinical Impression  Claudia Mercado appeared to present w/ adequate oropharyngeal phase swallow function w/ no overt s/s of aspiration noted during the swallowing of all trial consistencies; general aspiration precautions were followed. No decline in O2 sats or RR, vocal quality post trials. Oral phase appeared wfl for bolus management and oral clearing of trial consistencies given. Solids were not assessed at this evaluation - ongoing assessment when able to wear her dentures; Cognitive status continues to improve. Claudia Mercado helped to feed self but required assistance d/t mild UE weakness overall. Claudia Mercado also has some min confusion (medications?) and requires cues for follow through w/ tasks.  Recommend a dysphagia 1 diet w/ thin liquids via cup (no straw if coughing noted) w/ aspiration precautions; Meds in PUREE for safer, easier swallowing. Monitoring at all meals. ST will f/u w/ toleration of diet w/ trials to upgrade as appropriate while admitted.      Aspiration Risk   (reduced following aspiration precautions)    Diet Recommendation  Dysphagia 1 w/ Thin liquids; aspiration precautions; assistance at meals; monitoring.   Medication Administration: Whole meds with puree    Other  Recommendations Recommended Consults:  (Dietician consult) Oral Care Recommendations: Oral care BID;Staff/trained caregiver to provide oral care   Follow up Recommendations None (TBD)      Frequency and Duration min 3x week  2 weeks       Prognosis Prognosis for Safe Diet Advancement: Good      Swallow Study   General Date of Onset: 12/18/15 HPI: Claudia Mercado is a 75 y.o. female with a history of COPD who was found by her sister reportedly slumped over and barely breathing. EMS stated that her O2 sat was 88% on maybe 3 L outpatient nasal cannula oxygen, but minimally responsive, extremely tight/not moving air, and end-tidal CO2 reading in the 80s. She was placed on BiPAP and given magnesium, 2 or 3 DuoNeb's, and Solu-Medrol. Due to Claudia Mercado's declining Pulmonary status(BiPAP not sufficient), Claudia Mercado was orally intubated now extubated. Claudia Mercado has required medications for increased agitation but appears calm at the time of this evaluation. She is talking w/ SLP and answering basic questions re: self; oriented to self/place. Speech clear; follows basic commands w/ few cues.  Type of Study: Bedside Swallow Evaluation Previous Swallow Assessment: Claudia Mercado thought she  may have had ST services when she was intubated/extubated ~4 years ago. Back at her baseline.  Diet Prior to this Study: Regular;Thin liquids (wears dentures w/ meals per Claudia Mercado) Temperature Spikes Noted: No (wbc 14.5) Respiratory Status: Nasal cannula (4  liters) History of Recent Intubation: Yes Length of Intubations (days): 3 days Date extubated: 12/22/15 Behavior/Cognition: Alert;Cooperative;Pleasant mood;Confused;Distractible;Requires cueing (min) Oral Cavity Assessment: Within Functional Limits;Dry Oral Care Completed by SLP: Recent completion by staff Oral Cavity - Dentition: Edentulous (does wear her dentures at home) Vision: Functional for self-feeding Self-Feeding Abilities: Able to feed self;Needs assist;Needs set up Patient Positioning: Upright in bed Baseline Vocal Quality: Normal;Low vocal intensity Volitional Cough: Strong Volitional Swallow: Able to elicit    Oral/Motor/Sensory Function Overall Oral Motor/Sensory Function: Within functional limits   Ice Chips Ice chips: Within functional limits Presentation: Spoon (fed; 3 trials)   Thin Liquid Thin Liquid: Within functional limits Presentation: Cup;Self Fed;Straw (assisted; 5 trials via each mode) Other Comments: slight decreased awarenss of straw placement but wfl w/ her current medications    Nectar Thick Nectar Thick Liquid: Not tested   Honey Thick Honey Thick Liquid: Not tested   Puree Puree: Within functional limits Presentation: Self Fed;Spoon (10+ trials) Other Comments: assisted d/t UE weakness - min   Solid   GO   Solid: Not tested Other Comments: not wearing her dentures at this time         Jerilynn SomKatherine Watson, MS, CCC-SLP Watson,Katherine 12/23/2015,1:57 PM

## 2015-12-23 NOTE — Progress Notes (Signed)
PULMONARY / CRITICAL CARE MEDICINE   Name: Claudia Mercado MRN: [id number] DOB: 04-27-1940    ADMISSION DATE:  12/18/2015 CONSULTATION DATE:  12/18/15   Discussion: 75 yo white female smoker with acute on chronic resp hypoxic and hypercapnic failure from acute COPD exacerbation likely from viral bronchitis. Intubated 12/5 am. Extubated 12/7. Complicated by delirium on precedex.   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 Minimally changed from previous  P: Continuous Bipap wean as tolerated  Aggressive BD therapy Continue IV steroids wean as tolerated  Maintain O2 sats 88% to 92%   CARDIOVASCULAR A:  Hypertension P: Continuous telemetry monitoring Prn Hydralazine for systolic >150 or diastolic   RENAL A:  Hematuria- resolved P: Irrigate foley prn  Trend CBC's  Trend BMP's Replace electrolytes as indicated Monitor UOP  GASTROINTESTINAL A: No acute issues P: Pepcid for PUD prophylaxis  Keep NPO for now until off Bipap  HEMATOLOGIC A:  No acute issues P: Trend CBC's SCD's for VTE prophylaxis  Monitor s/sx of bleeding Transfuse for hgb <7  INFECTIOUS A:  Blood culture positive for staph species  Leukocytosis-worsening P: Continue empiric abx Trend WBC's and monitor fever curve Follow cultures   CULTURES: Influenza 12/3>>Negative Blood cultures 12/3>>positive for staph species Sputum cultures 12/5>>few candida albicans  Urine 12/5>>negative   ANTIBIOTICS: Ceftriaxone 12/3>> Azithromycin 12/3>>  NEUROLOGIC A: Metabolic encephalopathy likely secondary to hypercapnia-improving  Acute delirium and confusion.  P: Maintain RASS goal 0  Precedex gtt  for agitation Frequent orientation Lights on during the day Seroquel qhs.   STUDIES:  12/5 Renal ultrasound>> Unremarkable  SIGNIFICANT EVENTS: 12/5-Patient emergently intubated for acute Respiratory Failure 12/7-Patient extubated   LINES/TUBES: 12/20/15 ET tube>>12/7  SUBJECTIVE:  Pt agitated and combative with nursing staff and refusing Bipap.  Notified pts sister she spoke with pt and pt agreed to be placed back on BIpap and is resting comfortably.   VITAL SIGNS: BP (!) 151/83   Pulse 79   Temp 98.7 F (37.1 C) (Axillary)   Resp 19   Ht 5\' 5"  (1.651 m)   Wt 131 lb 6.3 oz (59.6 kg)   SpO2 97%   BMI 21.87 kg/m   HEMODYNAMICS:    VENTILATOR SETTINGS: Vent Mode: PSV FiO2 (%):  [40 %] 40 % PEEP:  [5 cmH20] 5 cmH20 Pressure Support:  [5 cmH20] 5 cmH20  INTAKE / OUTPUT: I/O last 3 completed shifts: In: 2303.6 [I.V.:803.6; NG/GT:1100; IV Piggyback:400] Out: 2590 [Urine:2375; Emesis/NG output:215]  PHYSICAL EXAMINATION: General:  Chronically ill frail appearing Caucasian female  Neuro:  Follows commands, oriented to place, intermittently agitated PERRL HEENT:  Atraumatic, supple, no JVD Cardiovascular:  NSR, s1s2, no MRG noted Lungs: diminished throughout, labored when off Bipap  Abdomen: +BS x4, mildly taut, non distended, non tender Musculoskeletal:  No obvious inflammation/deformity noted Skin:  Grossly intact  LABS:  BMET  Recent Labs Lab 12/20/15 0332 12/21/15 0330 12/22/15 0501  NA 144 145 146*  K 3.5 4.3 4.1  CL 95* 97* 98*  CO2 40* 42* 43*  BUN 37* 49* 39*  CREATININE 0.47 0.71 0.59  GLUCOSE 210* 237* 215*    Electrolytes  Recent Labs Lab 12/20/15 0332 12/20/15 1640 12/21/15 0330 12/21/15 1736 12/22/15 0501  CALCIUM 9.5  --  9.0  --  9.1  MG 2.3 2.5* 2.4 2.4  --   PHOS 2.4* 4.0 4.5 2.8  --     CBC  Recent Labs Lab 12/20/15 0332 12/21/15 0330 12/22/15 0501  WBC 12.7* 15.5* 14.5*  HGB 14.4 13.4 13.8  HCT 43.1 40.9 42.3   PLT 262 290 260    Coag's  Recent Labs Lab 12/18/15 1256 12/20/15 0815  APTT  --  28  INR 1.04 1.05    Sepsis Markers  Recent Labs Lab 12/18/15 1307 12/19/15 0407 12/20/15 0332 12/21/15 0330  LATICACIDVEN 1.5  --   --   --   PROCALCITON  --  <0.10 0.11 <0.10    ABG  Recent Labs Lab 12/20/15 0500 12/21/15 0433 12/22/15 0440  PHART 7.48* 7.28* 7.39  PCO2ART 63* 101* 86*  PO2ART 74* 105 67*    Liver Enzymes  Recent Labs Lab 12/18/15 1256 12/20/15 0815  AST 29 27  ALT 15 16  ALKPHOS 97  --   BILITOT 0.5  --   ALBUMIN 3.8  --     Cardiac Enzymes No results for input(s): TROPONINI, PROBNP in the last 168 hours.  Glucose  Recent Labs Lab 12/22/15 1610 12/22/15 1625 12/22/15 2006 12/22/15 2345 12/23/15 0346 12/23/15 0750  GLUCAP 178* 212* 179* 147* 163* 161*    Imaging No results found.  Update: Pts sister updated at bedside about plan of care and questions answered 12/23/15. Discussed code status, which she would like to discuss with patient's other family members.   Wells Guileseep Roddie Riegler, M.D.  Pulmonary/Critical Care PCCM Consult Pager [phone] (please enter 7 digits)  12/23/2015   Critical Care Attestation.  I have personally obtained a history, examined the patient, evaluated laboratory and imaging results, formulated the assessment and plan and placed orders. The Patient requires high complexity decision making for assessment and support, frequent evaluation and titration of therapies, application of advanced monitoring technologies and extensive interpretation of multiple databases. The patient has critical illness that could lead imminently to failure of 1 or more organ systems and requires the highest level of physician preparedness to intervene.  Critical Care Time devoted to patient care services described in this note is 45 minutes and is exclusive of time spent in procedures.

## 2015-12-23 NOTE — Progress Notes (Signed)
Patient had highly combative and agitated moment starting a little after nine. Pulled off bipap even with safety mitts on and would not let RN and NT place bipap or nasal cannula. Agitation not relieved by prn versed. Patient screaming and attempting to strike staff while O2 saturation decreased to mid/lower 80s. Sonda Rumbleana Blakeney NP arrived at bedside and spoke with patient. Patient partially oriented. Annabelle Harmanana NP got patient's family on phone and updated and then got family to calm patient over the phone. Patient now resting quietly. Still currently on 1.2 mcg/kg/hr of precedex.

## 2015-12-23 NOTE — Progress Notes (Signed)
Brief nutrition follow-up:  Pt s/p extubation 12/7, currently on Bipap. NPO. TF discontinued with extubation. SLP consulted for evaluation but pt cognition not appropraite for diet advancement at present.   Will continue to assess, await diet progression  Romelle StarcherCate Pat Elicker MS, RD, LDN [phone] Pager  [phone] Weekend/On-Call Pager

## 2015-12-23 NOTE — Progress Notes (Signed)
SLP Cancellation Note  Patient Details Name: Claudia Mercado MRN: [id number] DOB: 28-Apr-1940   Cancelled treatment:       Reason Eval/Treat Not Completed: Fatigue/lethargy limiting ability to participate;Medical issues which prohibited therapy;Patient's level of consciousness (reviewed chart notes; consulted NSG). NSG reported pt on Precedex to calm agitation. Recommended f/u later this PM or in the morning.   Jerilynn SomKatherine Watson, MS, CCC-SLP Watson,Katherine 12/23/2015, 10:13 AM

## 2015-12-23 NOTE — Progress Notes (Signed)
RN attempted to fully assess patient but patient swung at RN and NT and would not let RN place stethoscope on chest to allow for auscultation of lung, heart, and bowel sounds. Patient very confused. Would not let RN touch Bipap mask to try and remove for trial with nasal cannula. Patient would repeat phrases over and over when asked a questions but not logical speech. All of this on 1.2 mcg/kg/hr of precedex. RN notified MD about agitation and lack of prn medication available at this time. MD stated that he would review medications but did not want to over sedate patient due to respiratory status. Team will continue to monitor.

## 2015-12-24 ENCOUNTER — Inpatient Hospital Stay: Payer: Medicare Other

## 2015-12-24 DIAGNOSIS — I10 Essential (primary) hypertension: Secondary | ICD-10-CM

## 2015-12-24 DIAGNOSIS — E872 Acidosis: Secondary | ICD-10-CM

## 2015-12-24 LAB — BLOOD GAS, ARTERIAL
ACID-BASE EXCESS: 21.9 mmol/L — AB (ref 0.0–2.0)
Acid-Base Excess: 20.5 mmol/L — ABNORMAL HIGH (ref 0.0–2.0)
Acid-Base Excess: 19.4 mmol/L — ABNORMAL HIGH (ref 0.0–2.0)
Acid-Base Excess: 14.7 mmol/L — ABNORMAL HIGH (ref 0.0–2.0)
BICARBONATE: 52.1 mmol/L — AB (ref 20.0–28.0)
Bicarbonate: 48.4 mmol/L — ABNORMAL HIGH (ref 20.0–28.0)
Bicarbonate: 46.9 mmol/L — ABNORMAL HIGH (ref 20.0–28.0)
Bicarbonate: 40 mmol/L — ABNORMAL HIGH (ref 20.0–28.0)
DELIVERY SYSTEMS: POSITIVE
Delivery systems: POSITIVE
EXPIRATORY PAP: 6
EXPIRATORY PAP: 6
FIO2: 0.35
FIO2: 0.4
FIO2: 0.4
FIO2: 0.4
INSPIRATORY PAP: 18
INSPIRATORY PAP: 12
LHR: 8 {breaths}/min
LHR: 14 {breaths}/min
MECHVT: 450 mL
O2 SAT: 92.8 %
O2 Saturation: 98.3 %
O2 Saturation: 95.7 %
O2 Saturation: 98.3 %
PCO2 ART: 49 mmHg — AB (ref 32.0–48.0)
PEEP/CPAP: 5 cmH2O
PEEP: 5 cmH2O
PH ART: 7.48 — AB (ref 7.350–7.450)
PH ART: 7.39 (ref 7.350–7.450)
PH ART: 7.52 — AB (ref 7.350–7.450)
PO2 ART: 100 mmHg (ref 83.0–108.0)
Patient temperature: 37
Patient temperature: 37
Patient temperature: 37
Patient temperature: 37
RATE: 16 resp/min
VT: 450 mL
pCO2 arterial: 65 mmHg — ABNORMAL HIGH (ref 32.0–48.0)
pCO2 arterial: 63 mmHg — ABNORMAL HIGH (ref 32.0–48.0)
pCO2 arterial: 86 mmHg (ref 32.0–48.0)
pH, Arterial: 7.48 — ABNORMAL HIGH (ref 7.350–7.450)
pO2, Arterial: 102 mmHg (ref 83.0–108.0)
pO2, Arterial: 74 mmHg — ABNORMAL LOW (ref 83.0–108.0)
pO2, Arterial: 67 mmHg — ABNORMAL LOW (ref 83.0–108.0)

## 2015-12-24 LAB — CBC WITH DIFFERENTIAL/PLATELET
Basophils Absolute: 0.1 10*3/uL (ref 0–0.1)
Basophils Relative: 1 %
EOS ABS: 0 10*3/uL (ref 0–0.7)
Eosinophils Relative: 0 %
HCT: 41.4 % (ref 35.0–47.0)
HEMOGLOBIN: 13.9 g/dL (ref 12.0–16.0)
Lymphocytes Relative: 4 %
Lymphs Abs: 0.5 10*3/uL — ABNORMAL LOW (ref 1.0–3.6)
MCH: 31.7 pg (ref 26.0–34.0)
MCHC: 33.6 g/dL (ref 32.0–36.0)
MCV: 94.3 fL (ref 80.0–100.0)
Monocytes Absolute: 0.4 10*3/uL (ref 0.2–0.9)
Monocytes Relative: 3 %
NEUTROS PCT: 92 %
Neutro Abs: 11.8 10*3/uL — ABNORMAL HIGH (ref 1.4–6.5)
Platelets: 217 10*3/uL (ref 150–440)
RBC: 4.38 MIL/uL (ref 3.80–5.20)
RDW: 13 % (ref 11.5–14.5)
WBC: 12.8 10*3/uL — AB (ref 3.6–11.0)

## 2015-12-24 LAB — GLUCOSE, CAPILLARY
GLUCOSE-CAPILLARY: 109 mg/dL — AB (ref 65–99)
GLUCOSE-CAPILLARY: 112 mg/dL — AB (ref 65–99)
GLUCOSE-CAPILLARY: 119 mg/dL — AB (ref 65–99)
GLUCOSE-CAPILLARY: 149 mg/dL — AB (ref 65–99)
Glucose-Capillary: 156 mg/dL — ABNORMAL HIGH (ref 65–99)
Glucose-Capillary: 122 mg/dL — ABNORMAL HIGH (ref 65–99)
Glucose-Capillary: 180 mg/dL — ABNORMAL HIGH (ref 65–99)

## 2015-12-24 LAB — BASIC METABOLIC PANEL
ANION GAP: 8 (ref 5–15)
Anion gap: 6 (ref 5–15)
BUN: 27 mg/dL — ABNORMAL HIGH (ref 6–20)
BUN: 29 mg/dL — ABNORMAL HIGH (ref 6–20)
CALCIUM: 8.7 mg/dL — AB (ref 8.9–10.3)
CHLORIDE: 97 mmol/L — AB (ref 101–111)
CO2: 37 mmol/L — ABNORMAL HIGH (ref 22–32)
CO2: 31 mmol/L (ref 22–32)
CREATININE: 0.44 mg/dL (ref 0.44–1.00)
Calcium: 8.8 mg/dL — ABNORMAL LOW (ref 8.9–10.3)
Chloride: 98 mmol/L — ABNORMAL LOW (ref 101–111)
Creatinine, Ser: 0.49 mg/dL (ref 0.44–1.00)
GFR calc non Af Amer: >60 mL/min (ref >60)
Glucose, Bld: 135 mg/dL — ABNORMAL HIGH (ref 65–99)
Glucose, Bld: 210 mg/dL — ABNORMAL HIGH (ref 65–99)
POTASSIUM: 3.8 mmol/L (ref 3.5–5.1)
Potassium: 3.9 mmol/L (ref 3.5–5.1)
SODIUM: 140 mmol/L (ref 135–145)
SODIUM: 137 mmol/L (ref 135–145)

## 2015-12-24 LAB — CULTURE, RESPIRATORY

## 2015-12-24 LAB — MAGNESIUM
MAGNESIUM: 2.1 mg/dL (ref 1.7–2.4)
MAGNESIUM: 2.1 mg/dL (ref 1.7–2.4)

## 2015-12-24 LAB — TROPONIN I

## 2015-12-24 LAB — CULTURE, RESPIRATORY W GRAM STAIN

## 2015-12-24 LAB — PHOSPHORUS: Phosphorus: 4.8 mg/dL — ABNORMAL HIGH (ref 2.5–4.6)

## 2015-12-24 MED ORDER — NICOTINE 21 MG/24HR TD PT24
21.0000 mg | MEDICATED_PATCH | Freq: Every day | TRANSDERMAL | Status: DC
  Administered 2015-12-24 – 2015-12-28 (×5): 21 mg via TRANSDERMAL
  Filled 2015-12-24 (×5): qty 1

## 2015-12-24 MED ORDER — SODIUM CHLORIDE 0.9 % IV BOLUS (SEPSIS)
250.0000 mL | Freq: Once | INTRAVENOUS | Status: AC
  Administered 2015-12-24: 250 mL via INTRAVENOUS

## 2015-12-24 MED ORDER — SODIUM CHLORIDE 0.9 % IV BOLUS (SEPSIS)
500.0000 mL | Freq: Once | INTRAVENOUS | Status: AC
  Administered 2015-12-24: 500 mL via INTRAVENOUS

## 2015-12-24 MED ORDER — ORAL CARE MOUTH RINSE
15.0000 mL | Freq: Two times a day (BID) | OROMUCOSAL | Status: DC
  Administered 2015-12-24 – 2015-12-25 (×4): 15 mL via OROMUCOSAL

## 2015-12-24 MED ORDER — CHLORHEXIDINE GLUCONATE 0.12 % MT SOLN
15.0000 mL | Freq: Two times a day (BID) | OROMUCOSAL | Status: DC
  Administered 2015-12-24 – 2015-12-25 (×4): 15 mL via OROMUCOSAL
  Filled 2015-12-24 (×4): qty 15

## 2015-12-24 MED ORDER — HALOPERIDOL LACTATE 5 MG/ML IJ SOLN
5.0000 mg | Freq: Once | INTRAMUSCULAR | Status: AC
  Administered 2015-12-24: 5 mg via INTRAVENOUS
  Filled 2015-12-24: qty 1

## 2015-12-24 NOTE — Progress Notes (Signed)
PULMONARY / CRITICAL CARE MEDICINE   Name: Claudia Mercado F Robotham MRN: [id number] DOB: [birth year]    ADMISSION DATE:  12/18/2015 CONSULTATION DATE:  12/18/15   Discussion: 75 yo white female smoker with acute on chronic resp hypoxic and hypercapnic failure from acute COPD exacerbation likely from viral bronchitis. Intubated 12/5 am. Extubated 12/7. Complicated by delirium on precedex.   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) CXR 12/9; improvement in RLL atelectasis, small unchanged RLL pl effusion P: Continuous Bipap wean as tolerated  Aggressive BD therapy Continue IV steroids wean as tolerated  Maintain O2 sats 88% to 92%   CARDIOVASCULAR A:  Hypertension P: Continuous telemetry monitoring Prn Hydralazine for systolic >150 or diastolic   RENAL A:  Hematuria- resolved P: Irrigate foley prn  Trend CBC's  Trend BMP's Replace electrolytes as indicated Monitor UOP  GASTROINTESTINAL A: No acute issues P: Pepcid for PUD prophylaxis  Keep NPO for now until off Bipap  HEMATOLOGIC A:  No acute issues P: Trend CBC's SCD's for VTE prophylaxis  Monitor s/sx of bleeding Transfuse for hgb <7  INFECTIOUS A:  Blood culture positive for staph species  Leukocytosis-worsening P: Continue empiric abx Trend WBC's and monitor fever curve Follow cultures   CULTURES: Influenza 12/3>>Negative Blood cultures 12/3>>positive for staph species Sputum cultures 12/5>>few candida albicans  Urine 12/5>>negative   ANTIBIOTICS: Ceftriaxone 12/3>> Azithromycin 12/3>>  NEUROLOGIC A: Metabolic encephalopathy likely secondary to hypercapnia-improving  Acute delirium and confusion.  P: Maintain RASS goal 0  Precedex gtt for agitation Frequent orientation Lights on  during the day Seroquel qhs.   STUDIES:  12/5 Renal ultrasound>> Unremarkable  SIGNIFICANT EVENTS: 12/5-Patient emergently intubated for acute Respiratory Failure 12/7-Patient extubated   LINES/TUBES: 12/20/15 ET tube>>12/7  SUBJECTIVE:  Pt agitated and combative with nursing staff and refusing Bipap.  Notified pts sister she spoke with pt and pt agreed to be placed back on BIpap and is resting comfortably, given 1 time dose of Haldol last night also.  Ready for a trial of bipap this AM.   VITAL SIGNS: BP 132/66   Pulse 66   Temp (!) 96.9 F (36.1 C) (Axillary)   Resp 19   Ht 5\' 5"  (1.651 m)   Wt 134 lb 7.7 oz (61 kg)   SpO2 96%   BMI 22.38 kg/m   HEMODYNAMICS:    VENTILATOR SETTINGS: FiO2 (%):  [40 %] 40 %  INTAKE / OUTPUT: I/O last 3 completed shifts: In: 1000 [P.O.:240; I.V.:510; Other:100; IV Piggyback:150] Out: 2165 [Urine:2165]  PHYSICAL EXAMINATION: General:  Chronically ill frail appearing Caucasian female  Neuro:  Follows commands, oriented to place, intermittently agitated PERRL HEENT:  Atraumatic, supple, no JVD Cardiovascular:  NSR, s1s2, no MRG noted Lungs: diminished throughout, labored when off Bipap  Abdomen: +BS x4, mildly taut, non distended, non tender Musculoskeletal:  No obvious inflammation/deformity noted Skin:  Grossly intact  LABS:  BMET  Recent Labs Lab 12/21/15 0330 12/22/15 0501 12/24/15 0726  NA 145 146* 140  K 4.3 4.1 3.8  CL 97* 98* 97*  CO2 42* 43* 37*  BUN 49* 39* 27*  CREATININE 0.71 0.59 0.44  GLUCOSE 237* 215* 135*    Electrolytes  Recent Labs Lab 12/21/15 0330 12/21/15 1736 12/22/15 0501 12/24/15 0726  CALCIUM 9.0  --  9.1 8.8*  MG 2.4 2.4  --  2.1  PHOS 4.5 2.8  --  4.8*    CBC  Recent Labs Lab 12/21/15 0330 12/22/15 0501 12/24/15 0726  WBC 15.5* 14.5* 12.8*  HGB 13.4 13.8 13.9  HCT 40.9 42.3 41.4  PLT 290 260 217    Coag's  Recent Labs Lab 12/18/15 1256 12/20/15 0815  APTT  --   28  INR 1.04 1.05    Sepsis Markers  Recent Labs Lab 12/18/15 1307 12/19/15 0407 12/20/15 0332 12/21/15 0330  LATICACIDVEN 1.5  --   --   --   PROCALCITON  --  <0.10 0.11 <0.10    ABG  Recent Labs Lab 12/21/15 0433 12/22/15 0440 12/24/15 0201  PHART 7.28* 7.39 7.52*  PCO2ART 101* 86* 49*  PO2ART 105 67* 100    Liver Enzymes  Recent Labs Lab 12/18/15 1256 12/20/15 0815  AST 29 27  ALT 15 16  ALKPHOS 97  --   BILITOT 0.5  --   ALBUMIN 3.8  --     Cardiac Enzymes No results for input(s): TROPONINI, PROBNP in the last 168 hours.  Glucose  Recent Labs Lab 12/23/15 1208 12/23/15 1559 12/23/15 2017 12/24/15 0006 12/24/15 0407 12/24/15 0755  GLUCAP 165* 128* 194* 109* 156* 122*    Imaging Dg Chest Port 1 View  Result Date: 12/24/2015 CLINICAL DATA:  Acute respiratory failure EXAM: PORTABLE CHEST 1 VIEW COMPARISON:  12/22/2015 FINDINGS: Endotracheal tube removed.  NG removed. Improvement in right lower lobe airspace disease. Small right pleural effusion unchanged. Left lung is clear. Negative for heart failure IMPRESSION: Endotracheal tube and NG tube removed. Improvement in right lower lobe airspace disease. Small right effusion remains. Electronically Signed   By: Marlan Palauharles  Clark M.D.   On: 12/24/2015 07:02   I have personally obtained a history, examined the patient, evaluated laboratory and imaging results, formulated the assessment and plan and placed orders. CRITICAL CARE: The patient is critically ill with multiple organ systems failure and requires high complexity decision making for assessment and support, frequent evaluation and titration of therapies, application of advanced monitoring technologies and extensive interpretation of multiple databases. Critical Care Time devoted to patient care services described in this note is 40 minutes.    Stephanie AcreVishal Lowella Kindley, MD [facility] Pager [phone]- [phone] (please enter 7-digits) On Call  Pager - [phone] (please enter 7-digits)

## 2015-12-24 NOTE — Progress Notes (Signed)
eLink Physician-Brief Progress Note Patient Name: Claudia Mercado F Eke DOB: 03-02-1940 MRN: [id number]   Date of Service  12/24/2015  HPI/Events of Note  Multiple issues: 1. 13 beat run NSVT - Now NSR with rate = 73 and 2. Oliguria.   eICU Interventions  Will order: 1. BMP and Mg++ level now. 2. Cycle Troponins.  3. Bolus with 0.9 NaCl 500 mL IV over 30 minutes now.      Intervention Category Major Interventions: Arrhythmia - evaluation and management  Sommer,Steven Eugene 12/24/2015, 6:09 PM

## 2015-12-24 NOTE — Progress Notes (Signed)
Pt's urinary output has been 200mls since 0500. Dr. Dema SeverinMungal notified, will give pt a 250ml bolus of normal saline once.

## 2015-12-24 NOTE — Progress Notes (Signed)
Pt had a run of SVT. Pt denies CP and SOB. She states she was coughing around the time she had the run of SVT. Additionally, she has had 375ml of urinary output from 0500 until now. Elink called, [facility].

## 2015-12-24 NOTE — Progress Notes (Signed)
Discussed pt.'s care plan with Ms. Luci Bankukov, NP.  Will leave pt. On [place] if pt. Is able tolerate without adverse effect.  Will alert RT and continue to monitor pt. Closely.

## 2015-12-24 NOTE — Progress Notes (Signed)
Pt's respiratory culture came back positive for yeast growth. Dr. Dema SeverinMungal updated. No new orders received.

## 2015-12-24 NOTE — Progress Notes (Signed)
Pt. Very cooperative and calm in beginning of shift, placed on Bipap at 2300 without incident. Pt. Called staff for breaks to moisten mouth and became increasingly agitated with the Bipap as the night continued. At 0100 pt. Refused to where the Bipap.  Ms. Luci Bankukov, NP was consulted and a one-time dose of haldol was given as well as morphine. Only after having a phone discussion with her sister was the pt. Able to calm down and let staff put the Bipap back on. Precedex gtt running.  VSS, UOP decreased to btwn 10-20/h after 0200-Alerted Ms. Luci Bankukov, NP will continue KVO and monitor pt. Closely. Report given to Cindie LarocheSarah H.

## 2015-12-25 ENCOUNTER — Encounter: Payer: Self-pay | Admitting: *Deleted

## 2015-12-25 LAB — GLUCOSE, CAPILLARY
GLUCOSE-CAPILLARY: 123 mg/dL — AB (ref 65–99)
GLUCOSE-CAPILLARY: 141 mg/dL — AB (ref 65–99)
GLUCOSE-CAPILLARY: 188 mg/dL — AB (ref 65–99)
Glucose-Capillary: 159 mg/dL — ABNORMAL HIGH (ref 65–99)
Glucose-Capillary: 177 mg/dL — ABNORMAL HIGH (ref 65–99)
Glucose-Capillary: 162 mg/dL — ABNORMAL HIGH (ref 65–99)

## 2015-12-25 LAB — TROPONIN I
Troponin I: <0.03 ng/mL (ref <0.03)
Troponin I: <0.03 ng/mL (ref <0.03)

## 2015-12-25 MED ORDER — DILTIAZEM 12 MG/ML ORAL SUSPENSION
30.0000 mg | Freq: Four times a day (QID) | ORAL | Status: DC
  Administered 2015-12-25 – 2015-12-26 (×5): 30 mg via ORAL
  Filled 2015-12-25 (×14): qty 3

## 2015-12-25 MED ORDER — GABAPENTIN 300 MG PO CAPS
300.0000 mg | ORAL_CAPSULE | Freq: Three times a day (TID) | ORAL | Status: DC
  Administered 2015-12-25 – 2015-12-28 (×9): 300 mg via ORAL
  Filled 2015-12-25 (×9): qty 1

## 2015-12-25 MED ORDER — FAMOTIDINE 20 MG PO TABS
20.0000 mg | ORAL_TABLET | Freq: Two times a day (BID) | ORAL | Status: DC
  Administered 2015-12-25 – 2015-12-28 (×6): 20 mg via ORAL
  Filled 2015-12-25 (×6): qty 1

## 2015-12-25 NOTE — Progress Notes (Signed)
PHARMACIST - PHYSICIAN COMMUNICATION  DR:  Luberta MutterKonidena  CONCERNING: IV to Oral Route Change Policy  RECOMMENDATION: This patient is receiving famotidine by the intravenous route.  Based on criteria approved by the Pharmacy and Therapeutics Committee, the intravenous medication(s) is/are being converted to the equivalent oral dose form(s).   DESCRIPTION: These criteria include:  The patient is eating (either orally or via tube) and/or has been taking other orally administered medications for a least 24 hours  The patient has no evidence of active gastrointestinal bleeding or impaired GI absorption (gastrectomy, short bowel, patient on TNA or NPO).  If you have questions about this conversion, please contact the Pharmacy Department  []   [phone]( [phone] )  [facility] [x]   [phone]( [phone] )  [facility] []   [phone]( [phone] )  [facility] []   [phone]( [phone]  [facility] []   [phone]( [phone] )  [facility]   Horris LatinoHolly Gilliam, PharmD Pharmacy Resident 12/25/2015 11:35 AM

## 2015-12-25 NOTE — Plan of Care (Signed)
Cardizem given for Sinus Tachycardia  In the 110s

## 2015-12-25 NOTE — Progress Notes (Signed)
No acute events overnight. BP and HR stable. Mental status at baseline. No agitation overnight. Off precedex. SPO2 in the high 88-94 on 2L [place]. Patient is medically stable for transfer out of the unit. Report given to Dr. Tobi BastosPyreddy at 5:15am. Hospitalist will resume patient's care starting 12/25/15. PCCM will continue to see patient for severe COPD  [name] [name] ANP-BC Pulmonary and Critical Care Medicine [facility] Pager [phone] or [phone]

## 2015-12-25 NOTE — Progress Notes (Signed)
Pt. Remained on 2 L  O/N, O2Sats >92%.  Pt. Coughing up tan sputum and self-suctioning with [place]. VSS, no gtt's running, UOP adequate. No issues to report.

## 2015-12-25 NOTE — Progress Notes (Signed)
Speech Language Pathology Dysphagia Treatment Patient Details Name: Josephina Shiheggy F Karg MRN: [id number] DOB: [birth year] Today's Date: 12/25/2015 Time: 1040-1100 SLP Time Calculation (min) (ACUTE ONLY): 20 min  Assessment / Plan / Recommendation Clinical Impression   pt presents with a mild oral pharyngeal dysphagia characterized by immediate cough with dry regular/ dys 3 solids. Pt with no overt ssx aspiration with dys 2 with thin via straw trials. SLP provided education on diet upgrade and pt able to give verbal understanding. SLP educated RN who verbally agreed. Pt requires increased moisture on foods due to dentures not fitting properly and difficulty with mastication.     Diet Recommendation    Dys 2 with thin. Pt may have straw   SLP Plan Continue with current plan of care   Pertinent Vitals/Pain No pain reported   Swallowing Goals     General Behavior/Cognition: Alert;Cooperative;Pleasant mood Patient Positioning: Upright in bed Oral care provided: N/A HPI: Pt is a 75 y.o. female with a history of COPD who was found by her sister reportedly slumped over and barely breathing. EMS stated that her O2 sat was 88% on maybe 3 L outpatient nasal cannula oxygen, but minimally responsive, extremely tight/not moving air, and end-tidal CO2 reading in the 80s. She was placed on BiPAP and given magnesium, 2 or 3 DuoNeb's, and Solu-Medrol. Due to pt's declining Pulmonary status(BiPAP not sufficient), pt was orally intubated now extubated. Pt has required medications for increased agitation but appears calm at the time of this evaluation. She is talking w/ SLP and answering basic questions re: self; oriented to self/place. Speech clear; follows basic commands w/ few cues.   Oral Cavity - Oral Hygiene Patient is AT RISK: Order set for Adult Oral Care Standing Orders initiated -  "At Risk Patients" option selected (see row information)   Dysphagia Treatment Treatment Methods: Skilled  observation;Upgraded PO texture trial;Patient/caregiver education;Compensation strategy training Patient observed directly with PO's: Yes Type of PO's observed: Dysphagia 3 (soft);Dysphagia 2 (chopped);Dysphagia 1 (puree);Thin liquids Feeding: Able to feed self Liquids provided via: Teaspoon;Cup;Straw Oral Phase Signs & Symptoms: Prolonged mastication Pharyngeal Phase Signs & Symptoms: Delayed cough Type of cueing: Verbal   GO     Joycelyn RuaStacie Harris Sauber, MA/CCC-SLP 12/25/2015, 11:23 AM

## 2015-12-25 NOTE — Plan of Care (Signed)
Problem: Bowel/Gastric: Goal: Will not experience complications related to bowel motility Outcome: Progressing BP (!) 156/87   Pulse (!) 115   Temp 98.4 F (36.9 C) (Axillary)   Resp (!) 23   Ht 5\' 5"  (1.651 m)   Wt 62 kg (136 lb 11 oz)   SpO2 94%   BMI 22.75 kg/m   POC reviewed with patient, cont on q2 turns, vital signs closely monitored and any concerns addressed at this time. Will continue to monitor.    Mental Orientation: A&O x 4 Telemetry: Pt placed on monitor. Central tele and Elink aware of pt Assessment: Completed Skin: wnl  IV: flushes easily, no pain, no blood return Pain: no pain  Environmental changes completed to facilitate rest and relaxation.  Safety Measures: Bed alarm obn, 2/4 bed rails up.  Unit Orientation: Pt and family oriented to room, has received patient guide, and taught how to use call bell system.

## 2015-12-25 NOTE — Progress Notes (Signed)
[facility]   PATIENT NAME: Claudia Mercado    MR#:  [id number]  DATE OF BIRTH:  1940/10/21  SUBJECTIVE: Transferred from ICU service. Admitted to ICU for respiratory failure, intubated, extubated.  Today she feels better. Some cough. Tachycardia noted. Rate up to 120s.   CHIEF COMPLAINT:   Chief Complaint  Patient presents with  . Respiratory Distress    REVIEW OF SYSTEMS:   ROS CONSTITUTIONAL: No fever, fatigue or weakness.  EYES: No blurred or double vision.  EARS, NOSE, AND THROAT: No tinnitus or ear pain.  RESPIRATORY: cough, shortness of breath, wheezing or hemoptysis.  CARDIOVASCULAR: No chest pain, orthopnea, edema.  GASTROINTESTINAL: No nausea, vomiting, diarrhea or abdominal pain.  GENITOURINARY: No dysuria, hematuria.  ENDOCRINE: No polyuria, nocturia,  HEMATOLOGY: No anemia, easy bruising or bleeding SKIN: No rash or lesion. MUSCULOSKELETAL: No joint pain or arthritis.   NEUROLOGIC: No tingling, numbness, weakness.  PSYCHIATRY: No anxiety or depression.   DRUG ALLERGIES:  No Known Allergies  VITALS:  Blood pressure 125/76, pulse (!) 105, temperature 98.5 F (36.9 C), temperature source Oral, resp. rate (!) 29, height 5\' 5"  (1.651 m), weight 62 kg (136 lb 11 oz), SpO2 94 %.  PHYSICAL EXAMINATION:  GENERAL:  75 y.o.-year-old patient lying in the bed with no acute distress.  EYES: Pupils equal, round, reactive to light and accommodation. No scleral icterus. Extraocular muscles intact.  HEENT: Head atraumatic, normocephalic. Oropharynx and nasopharynx clear.  NECK:  Supple, no jugular venous distention. No thyroid enlargement, no tenderness.  LUNGS: Normal breath sounds bilaterally, no wheezing, rales,rhonchi or crepitation. No use of accessory muscles of respiration.  CARDIOVASCULAR: S1, S2 normal. No murmurs, rubs, or gallops.  ABDOMEN: Soft, nontender, nondistended. Bowel sounds present. No organomegaly or mass.   EXTREMITIES: No pedal edema, cyanosis, or clubbing.  NEUROLOGIC: Cranial nerves II through XII are intact. Muscle strength 5/5 in all extremities. Sensation intact. Gait not checked.  PSYCHIATRIC: The patient is alert and oriented x 3.  SKIN: No obvious rash, lesion, or ulcer.    LABORATORY PANEL:   CBC  Recent Labs Lab 12/24/15 0726  WBC 12.8*  HGB 13.9  HCT 41.4  PLT 217   ------------------------------------------------------------------------------------------------------------------  Chemistries   Recent Labs Lab 12/20/15 0815  12/24/15 1818  NA  --   < > 137  K  --   < > 3.9  CL  --   < > 98*  CO2  --   < > 31  GLUCOSE  --   < > 210*  BUN  --   < > 29*  CREATININE  --   < > 0.49  CALCIUM  --   < > 8.7*  MG  --   < > 2.1  AST 27  --   --   ALT 16  --   --   < > = values in this interval not displayed. ------------------------------------------------------------------------------------------------------------------  Cardiac Enzymes  Recent Labs Lab 12/25/15 0636  TROPONINI <0.03   ------------------------------------------------------------------------------------------------------------------  RADIOLOGY:  Dg Chest Port 1 View  Result Date: 12/24/2015 CLINICAL DATA:  Acute respiratory failure EXAM: PORTABLE CHEST 1 VIEW COMPARISON:  12/22/2015 FINDINGS: Endotracheal tube removed.  NG removed. Improvement in right lower lobe airspace disease. Small right pleural effusion unchanged. Left lung is clear. Negative for heart failure IMPRESSION: Endotracheal tube and NG tube removed. Improvement in right lower lobe airspace disease. Small right effusion remains. Electronically Signed   By: Marlan Palauharles  Clark  M.D.   On: 12/24/2015 07:02    EKG:  No orders found for this or any previous visit.  ASSESSMENT AND PLAN:  Acute on chronic respiratory failure with hypoxia, hypercapnia secondary to COPD exacerbation, viral bronchitis: Intubated on 12/5, extubated and  transferred to regular floor. Patient feels better today. No hypoxia. On 2 L saturation 94%. Continue IV steroids, nebulizers, watch closely. blood showed staph species, white count is elevated. Continue empiric antibiotics, follow WBC, fever curve.  #2 SVT: Added Cardizem.  #3 hypertension: Controlled Nutrition ;dysphagia 2 diet with thin liquids; seen by speech therapy.  DC Foley   All the records are reviewed and case discussed with Care Management/Social Workerr. Management plans discussed with the patient, family and they are in agreement.  CODE STATUS: full code  TOTAL TIME TAKING CARE OF THIS PATIENT: 35 minutes.   POSSIBLE D/C IN 1-2 DAYS, DEPENDING ON CLINICAL CONDITION.   Katha HammingKONIDENA,Ziasia Lenoir M.D on 12/25/2015 at 1:24 PM  Between 7am to 6pm - Pager - [phone]  After 6pm go to [email or web address] - password [email or web address]  [facility]  Office  [phone]  CC: Primary care physician; Rolm GalaGRANDIS, HEIDI, MD   Note: This dictation was prepared with Dragon dictation along with smaller phrase technology. Any transcriptional errors that result from this process are unintentional.

## 2015-12-26 LAB — GLUCOSE, CAPILLARY
GLUCOSE-CAPILLARY: 145 mg/dL — AB (ref 65–99)
GLUCOSE-CAPILLARY: 194 mg/dL — AB (ref 65–99)
Glucose-Capillary: 176 mg/dL — ABNORMAL HIGH (ref 65–99)
Glucose-Capillary: 243 mg/dL — ABNORMAL HIGH (ref 65–99)
Glucose-Capillary: 117 mg/dL — ABNORMAL HIGH (ref 65–99)

## 2015-12-26 MED ORDER — DILTIAZEM 12 MG/ML ORAL SUSPENSION
60.0000 mg | Freq: Four times a day (QID) | ORAL | Status: DC
  Administered 2015-12-26 – 2015-12-28 (×7): 60 mg via ORAL
  Filled 2015-12-26 (×15): qty 6

## 2015-12-26 MED ORDER — ENOXAPARIN SODIUM 40 MG/0.4ML ~~LOC~~ SOLN
40.0000 mg | SUBCUTANEOUS | Status: DC
  Administered 2015-12-26 – 2015-12-27 (×2): 40 mg via SUBCUTANEOUS
  Filled 2015-12-26 (×2): qty 0.4

## 2015-12-26 MED ORDER — ATORVASTATIN CALCIUM 20 MG PO TABS
40.0000 mg | ORAL_TABLET | Freq: Every day | ORAL | Status: DC
  Administered 2015-12-26 – 2015-12-27 (×2): 40 mg via ORAL
  Filled 2015-12-26 (×2): qty 2

## 2015-12-26 NOTE — Progress Notes (Signed)
Nutrition Follow-up  DOCUMENTATION CODES:   Non-severe (moderate) malnutrition in context of chronic illness  INTERVENTION:  Provide snacks PO BID (cottage cheese with peaches). RD has ordered.  NUTRITION DIAGNOSIS:   Increased nutrient needs related to catabolic illness (COPD Exacerbation) as evidenced by estimated needs.  Ongoing.  GOAL:   Patient will meet greater than or equal to 90% of their needs  Progressing.  MONITOR:   PO intake, Labs, Weight trends, I & O's  REASON FOR ASSESSMENT:   Consult Enteral/tube feeding initiation and management  ASSESSMENT:   75 yo female admitted with acute on chronic respiratory failure from acute COPD exacerbation  -Patient was extubated on 12/7 and TF discontinued with extubation. -Patient was advanced to Dysphagia 1 Diet with Thin Liquids on 12/8. Subsequently advanced to Dysphagia 2 Deit with Thin Liquids on 12/10 after SLP Evaluation.  Spoke with patient at bedside. She reports good appetite. Also reports appetite was good PTA. Denies N/V, abdominal pain, constipation/diarrhea, or difficulty chewing/swallowing. Patient reports she usually sleeps through breakfast. At lunch she will have toast with jelly, then at dinner she will have chicken with baked potato and cheese, then she will have a bowl of cereal with milk before bed.   UBW 154 lbs. Patient reports her weight was stable PTA but believes she might have lost weight during admission. Since admission weight has increased from 130 lbs on 12/3 to current body weight of 143 lbs. Today's weight likely taken on a different scale.   Discussed importance of adequate calories and protein with patient due to increased needs during acute COPD exacerbation. Patient refusing all offered oral nutrition supplements. She is amenable to snacks BID (cottage cheese with peaches).   Meal Completion: 50-100% In the past 24 hours patient has had approximately 1375 kcal and 65 grams of  protein.  Medications reviewed and include: famotidine, Novolog sliding scale Q4hrs, methylprednisolone 40 mg Q12hrs.  Labs reviewed: CBG 145-194 past 24 hrs, No recent Chem Profile.   Nutrition-Focused physical exam completed. Findings are mild fat depletion, mild-moderate muscle depletion, and no edema.   Patient meets criteria for moderate chronic malnutrition due to NFPE findings of mild fat depletion and mild to moderate muscle depletion.  Diet Order:  DIET DYS 2 Room service appropriate? Yes; Fluid consistency: Thin  Skin:  Reviewed, no issues  Last BM:  12/25/2015  Height:   Ht Readings from Last 1 Encounters:  12/18/15 5\' 5"  (1.651 m)    Weight:   Wt Readings from Last 1 Encounters:  12/26/15 143 lb 4.8 oz (65 kg)    Ideal Body Weight:  56.82 kg  BMI:  Body mass index is 23.85 kg/m.  Estimated Nutritional Needs:   Kcal:  1440-1680 (HBE x 1.2-1.4)  Protein:  80-91 grams (1.2-1.4 grams/kg)  Fluid:  >/= 1.6 L/day (25 ml/kg)  EDUCATION NEEDS:   Education needs addressed  Helane RimaLeanne Tyson Masin, MS, RD, LDN Pager: [phone] After Hours Pager: [phone]

## 2015-12-26 NOTE — Progress Notes (Signed)
[facility] [place] at [facility]   PATIENT NAME: Claudia Mercado    MR#:  [id number]  DATE OF BIRTH:  1940-11-22  SUBJECTIVE: Shortness of breath deconditioned. Feels very weak..   CHIEF COMPLAINT:   Chief Complaint  Patient presents with  . Respiratory Distress    REVIEW OF SYSTEMS:   ROS CONSTITUTIONAL: No fever, fatigue or weakness.  EYES: No blurred or double vision.  EARS, NOSE, AND THROAT: No tinnitus or ear pain.  RESPIRATORY: cough, shortness of breath, wheezing or hemoptysis.  CARDIOVASCULAR: No chest pain, orthopnea, edema.  GASTROINTESTINAL: No nausea, vomiting, diarrhea or abdominal pain.  GENITOURINARY: No dysuria, hematuria.  ENDOCRINE: No polyuria, nocturia,  HEMATOLOGY: No anemia, easy bruising or bleeding SKIN: No rash or lesion. MUSCULOSKELETAL: No joint pain or arthritis.   NEUROLOGIC: No tingling, numbness, weakness.  PSYCHIATRY: No anxiety or depression.   DRUG ALLERGIES:  No Known Allergies  VITALS:  Blood pressure (!) 143/71, pulse (!) 106, temperature 97.5 F (36.4 C), temperature source Oral, resp. rate (!) 24, height 5\' 5"  (1.651 m), weight 65 kg (143 lb 4.8 oz), SpO2 95 %.  PHYSICAL EXAMINATION:  GENERAL:  75 y.o.-year-old patient lying in the bed with no acute distress.  EYES: Pupils equal, round, reactive to light and accommodation. No scleral icterus. Extraocular muscles intact.  HEENT: Head atraumatic, normocephalic. Oropharynx and nasopharynx clear.  NECK:  Supple, no jugular venous distention. No thyroid enlargement, no tenderness.  LUNGS: Normal breath sounds bilaterally, no wheezing, rales,rhonchi or crepitation. No use of accessory muscles of respiration.  CARDIOVASCULAR: S1, S2 normal. No murmurs, rubs, or gallops.  ABDOMEN: Soft, nontender, nondistended. Bowel sounds present. No organomegaly or mass.  EXTREMITIES: No pedal edema, cyanosis, or clubbing.  NEUROLOGIC: Cranial nerves II through XII are intact.  Muscle strength 5/5 in all extremities. Sensation intact. Gait not checked.  PSYCHIATRIC: The patient is alert and oriented x 3.  SKIN: No obvious rash, lesion, or ulcer.    LABORATORY PANEL:   CBC  Recent Labs Lab 12/24/15 0726  WBC 12.8*  HGB 13.9  HCT 41.4  PLT 217   ------------------------------------------------------------------------------------------------------------------  Chemistries   Recent Labs Lab 12/20/15 0815  12/24/15 1818  NA  --   < > 137  K  --   < > 3.9  CL  --   < > 98*  CO2  --   < > 31  GLUCOSE  --   < > 210*  BUN  --   < > 29*  CREATININE  --   < > 0.49  CALCIUM  --   < > 8.7*  MG  --   < > 2.1  AST 27  --   --   ALT 16  --   --   < > = values in this interval not displayed. ------------------------------------------------------------------------------------------------------------------  Cardiac Enzymes  Recent Labs Lab 12/25/15 0636  TROPONINI <0.03   ------------------------------------------------------------------------------------------------------------------  RADIOLOGY:  No results found.  EKG:  No orders found for this or any previous visit.  ASSESSMENT AND PLAN:  Acute on chronic respiratory failure with hypoxia, hypercapnia secondary to COPD exacerbation, viral bronchitis: Intubated on 12/5, extubated and transferred to regular floor. Patient feels better today. No hypoxia. On 2 L saturation 94%. Continue IV steroids, nebulizers, watch closely. blood showed staph species, white count is elevated. Continue empiric antibiotics, follow WBC, fever curve.  #2 SVT: .continue and adjust cardizem,  #3 hypertension: Controlled 4.Nutrition ;dysphagia 2 diet with thin liquids;  seen by speech therapy.  #5 deconditioning, generalized weakness: Physical therapy consulted.   All the records are reviewed and case discussed with Care Management/Social Workerr. Management plans discussed with the patient, family and they are in  agreement.  CODE STATUS: full code  TOTAL TIME TAKING CARE OF THIS PATIENT: 35 minutes.   POSSIBLE D/C IN 1-2 DAYS, DEPENDING ON CLINICAL CONDITION.   Katha HammingKONIDENA,Seferina Brokaw M.D on 12/26/2015 at 2:58 PM  Between 7am to 6pm - Pager - [phone]  After 6pm go to [email or web address] - password [email or web address]  [facility]  Office  [phone]  CC: Primary care physician; Rolm GalaGRANDIS, HEIDI, MD   Note: This dictation was prepared with Dragon dictation along with smaller phrase technology. Any transcriptional errors that result from this process are unintentional.

## 2015-12-27 DIAGNOSIS — E440 Moderate protein-calorie malnutrition: Secondary | ICD-10-CM | POA: Insufficient documentation

## 2015-12-27 LAB — GLUCOSE, CAPILLARY
GLUCOSE-CAPILLARY: 131 mg/dL — AB (ref 65–99)
GLUCOSE-CAPILLARY: 163 mg/dL — AB (ref 65–99)
GLUCOSE-CAPILLARY: 165 mg/dL — AB (ref 65–99)
GLUCOSE-CAPILLARY: 192 mg/dL — AB (ref 65–99)
GLUCOSE-CAPILLARY: 210 mg/dL — AB (ref 65–99)
GLUCOSE-CAPILLARY: 262 mg/dL — AB (ref 65–99)
Glucose-Capillary: 151 mg/dL — ABNORMAL HIGH (ref 65–99)

## 2015-12-27 LAB — CBC
HEMATOCRIT: 42.1 % (ref 35.0–47.0)
Hemoglobin: 13.9 g/dL (ref 12.0–16.0)
MCH: 31.1 pg (ref 26.0–34.0)
MCHC: 33.1 g/dL (ref 32.0–36.0)
MCV: 94 fL (ref 80.0–100.0)
Platelets: 225 10*3/uL (ref 150–440)
RBC: 4.48 MIL/uL (ref 3.80–5.20)
RDW: 13.4 % (ref 11.5–14.5)
WBC: 13.7 10*3/uL — AB (ref 3.6–11.0)

## 2015-12-27 MED ORDER — CEFTRIAXONE SODIUM-DEXTROSE 2-2.22 GM-% IV SOLR
2.0000 g | INTRAVENOUS | Status: DC
  Administered 2015-12-27: 2 g via INTRAVENOUS
  Filled 2015-12-27 (×2): qty 50

## 2015-12-27 NOTE — [place] FL2 (Signed)
[place] MEDICAID FL2 LEVEL OF CARE SCREENING TOOL     IDENTIFICATION  Patient Name: Claudia Mercado Birthdate: 1940-12-10 Sex: female Admission Date (Current Location): 12/18/2015  [place]   Facility and Address:  [facility], [address], [place], [place] [address]      Provider Number: [id number]  Attending Physician Name and Address:  Katha HammingSnehalatha Konidena, MD  Relative Name and Phone Number:       Current Level of Care: Hospital Recommended Level of Care: Skilled Nursing Facility Prior Approval Number:    Date Approved/Denied:   PASRR Number: [id number]  Discharge Plan: SNF    Current Diagnoses: Patient Active Problem List   Diagnosis Date Noted  . Malnutrition of moderate degree 12/27/2015  . Respiratory failure (HCC) 12/18/2015    Orientation RESPIRATION BLADDER Height & Weight     Self, Time, Situation, Place  Normal Continent Weight: 141 lb 12.8 oz (64.3 kg) Height:  5\' 5"  (165.1 cm)  BEHAVIORAL SYMPTOMS/MOOD NEUROLOGICAL BOWEL NUTRITION STATUS   (none)  (none) Continent Diet (dys 2)  AMBULATORY STATUS COMMUNICATION OF NEEDS Skin   Limited Assist Verbally Normal                       Personal Care Assistance Level of Assistance  Bathing, Dressing Bathing Assistance: Limited assistance   Dressing Assistance: Limited assistance     Functional Limitations Info   (no issues)          SPECIAL CARE FACTORS FREQUENCY  PT (By licensed PT)                    Contractures Contractures Info: Not present    Additional Factors Info  Code Status, Allergies Code Status Info: full Allergies Info: nka           Current Medications (12/27/2015):  This is the current hospital active medication list Current Facility-Administered Medications  Medication Dose Route Frequency Provider Last Rate Last Dose  . 0.9 %  sodium chloride infusion  250 mL Intravenous PRN Erin FullingKurian Kasa, MD       . 0.9 %  sodium chloride infusion  250 mL Intravenous PRN Erin FullingKurian Kasa, MD      . acetaminophen (TYLENOL) tablet 650 mg  650 mg Oral Q4H PRN Erin FullingKurian Kasa, MD   650 mg at 12/25/15 0545  . atorvastatin (LIPITOR) tablet 40 mg  40 mg Oral q1800 Katha HammingSnehalatha Konidena, MD   40 mg at 12/26/15 1813  . budesonide (PULMICORT) nebulizer solution 0.5 mg  0.5 mg Nebulization BID Erin FullingKurian Kasa, MD   0.5 mg at 12/27/15 0823  . diltiazem (CARDIZEM) 10 mg/ml oral suspension 60 mg  60 mg Oral Q6H Katha HammingSnehalatha Konidena, MD   60 mg at 12/27/15 1122  . enoxaparin (LOVENOX) injection 40 mg  40 mg Subcutaneous Q24H Katha HammingSnehalatha Konidena, MD   40 mg at 12/26/15 2036  . famotidine (PEPCID) tablet 20 mg  20 mg Oral BID Rolm BaptiseHolly N Gilliam, RPH   20 mg at 12/27/15 1114  . feeding supplement (ENSURE ENLIVE) (ENSURE ENLIVE) liquid 237 mL  237 mL Oral BID BM Eugenie Norrieana G Blakeney, NP   237 mL at 12/27/15 1116  . gabapentin (NEURONTIN) capsule 300 mg  300 mg Oral TID Lewie LoronMagadalene S Tukov, NP   300 mg at 12/27/15 1114  . guaiFENesin (ROBITUSSIN) 100 MG/5ML solution 100 mg  5 mL Oral Q4H PRN Lewie LoronMagadalene S Tukov, NP   100 mg  at 12/25/15 1125  . hydrALAZINE (APRESOLINE) injection 10-20 mg  10-20 mg Intravenous Q4H PRN Gwendolyn FillBincy S Varughese, NP   10 mg at 12/23/15 0611  . insulin aspart (novoLOG) injection 0-15 Units  0-15 Units Subcutaneous Q4H Shane CrutchPradeep Ramachandran, MD   3 Units at 12/27/15 1245  . ipratropium-albuterol (DUONEB) 0.5-2.5 (3) MG/3ML nebulizer solution 3 mL  3 mL Nebulization Q6H Erin FullingKurian Kasa, MD   3 mL at 12/27/15 0823  . methylPREDNISolone sodium succinate (SOLU-MEDROL) 40 mg/mL injection 40 mg  40 mg Intravenous Q12H Shane CrutchPradeep Ramachandran, MD   40 mg at 12/27/15 1345  . morphine 4 MG/ML injection 2 mg  2 mg Intravenous Q3H PRN Lewie LoronMagadalene S Tukov, NP   2 mg at 12/24/15 0153  . nicotine (NICODERM CQ - dosed in mg/24 hours) patch 21 mg  21 mg Transdermal Daily Lewie LoronMagadalene S Tukov, NP   21 mg at 12/27/15 1116  . ondansetron (ZOFRAN) injection 4 mg   4 mg Intravenous Q6H PRN Erin FullingKurian Kasa, MD      . polyethylene glycol (MIRALAX / GLYCOLAX) packet 17 g  17 g Oral Daily PRN Eugenie Norrieana G Blakeney, NP   17 g at 12/25/15 0803  . QUEtiapine (SEROQUEL XR) 24 hr tablet 50 mg  50 mg Oral QHS Shane CrutchPradeep Ramachandran, MD   50 mg at 12/26/15 2037  . sodium chloride flush (NS) 0.9 % injection 3 mL  3 mL Intravenous Q12H Erin FullingKurian Kasa, MD   3 mL at 12/27/15 1118  . sodium chloride flush (NS) 0.9 % injection 3 mL  3 mL Intravenous PRN Erin FullingKurian Kasa, MD         Discharge Medications: Please see discharge summary for a list of discharge medications.  Relevant Imaging Results:  Relevant Lab Results:   Additional Information ss: [id number] a  York SpanielMonica Xayden Linsey, LCSW

## 2015-12-27 NOTE — Clinical Social Work Note (Signed)
Clinical Social Work Assessment  Patient Details  Name: Claudia Mercado MRN: [id number] Date of Birth: 08/09/1940  Date of referral:  12/27/15               Reason for consult:  Facility Placement                Permission sought to share information with:  Chartered certified accountant granted to share information::  Yes, Verbal Permission Granted  Name::        Agency::     Relationship::     Contact Information:     Housing/Transportation Living arrangements for the past 2 months:  Single Family Home Source of Information:  Patient Patient Interpreter Needed:  None Criminal Activity/Legal Involvement Pertinent to Current Situation/Hospitalization:  No - Comment as needed Significant Relationships:  Siblings Lives with:  Self Do you feel safe going back to the place where you live?  Yes Need for family participation in patient care:  No (Coment)  Care giving concerns:  Patient resides at home and is typically independent in ADL's.   Social Worker assessment / plan:  CSW met with patient this afternoon to discuss PT recommendations for STR. Patient stated to CSW that she was planning on going to [facility] as she has been there. Patient stated that she felt that she needed some strengthening prior to being able to return home.   Employment status:  Retired Nurse, adult PT Recommendations:  [place] / Referral to community resources:     Patient/Family's Response to care:  Patient expressed appreciation for CSW assistance.  Patient/Family's Understanding of and Emotional Response to Diagnosis, Current Treatment, and Prognosis:  Patient is aware that she has limitations currently and is willing to do what she needs to in order to be able to return home.  Emotional Assessment Appearance:  Appears stated age Attitude/Demeanor/Rapport:   (pleasant and cooperative) Affect (typically observed):  Accepting,  Adaptable, Pleasant Orientation:  Oriented to Self, Oriented to Place, Oriented to  Time, Oriented to Situation Alcohol / Substance use:  Not Applicable Psych involvement (Current and /or in the community):  No (Comment)  Discharge Needs  Concerns to be addressed:  Care Coordination Readmission within the last 30 days:  No Current discharge risk:  None Barriers to Discharge:  No Barriers Identified   Shela Leff, LCSW 12/27/2015, 2:45 PM

## 2015-12-27 NOTE — Progress Notes (Signed)
[facility] [place] [facility]   PATIENT NAME: Claudia Mercado    MR#:  [id number]  DATE OF BIRTH:  20-Nov-1940  Pt is  seen today. Shortness of breath improved.   CHIEF COMPLAINT:   Chief Complaint  Patient presents with  . Respiratory Distress    REVIEW OF SYSTEMS:   ROS CONSTITUTIONAL: No fever, fatigue or weakness.  EYES: No blurred or double vision.  EARS, NOSE, AND THROAT: No tinnitus or ear pain.  RESPIRATORY: cough, shortness of breath, wheezing or hemoptysis.  CARDIOVASCULAR: No chest pain, orthopnea, edema.  GASTROINTESTINAL: No nausea, vomiting, diarrhea or abdominal pain.  GENITOURINARY: No dysuria, hematuria.  ENDOCRINE: No polyuria, nocturia,  HEMATOLOGY: No anemia, easy bruising or bleeding SKIN: No rash or lesion. MUSCULOSKELETAL: No joint pain or arthritis.   NEUROLOGIC: No tingling, numbness, weakness.  PSYCHIATRY: No anxiety or depression.   DRUG ALLERGIES:  No Known Allergies  VITALS:  Blood pressure 123/68, pulse 77, temperature 98.2 F (36.8 C), temperature source Oral, resp. rate 20, height 5\' 5"  (1.651 m), weight 64.3 kg (141 lb 12.8 oz), SpO2 94 %.  PHYSICAL EXAMINATION:  GENERAL:  75 y.o.-year-old patient lying in the bed with no acute distress.  EYES: Pupils equal, round, reactive to light and accommodation. No scleral icterus. Extraocular muscles intact.  HEENT: Head atraumatic, normocephalic. Oropharynx and nasopharynx clear.  NECK:  Supple, no jugular venous distention. No thyroid enlargement, no tenderness.  LUNGS: Normal breath sounds bilaterally, no wheezing, rales,rhonchi or crepitation. No use of accessory muscles of respiration.  CARDIOVASCULAR: S1, S2 normal. No murmurs, rubs, or gallops.  ABDOMEN: Soft, nontender, nondistended. Bowel sounds present. No organomegaly or mass.  EXTREMITIES: No pedal edema, cyanosis, or clubbing.  NEUROLOGIC: Cranial nerves II through XII are intact. Muscle strength 5/5 in  all extremities. Sensation intact. Gait not checked.  PSYCHIATRIC: The patient is alert and oriented x 3.  SKIN: No obvious rash, lesion, or ulcer.    LABORATORY PANEL:   CBC  Recent Labs Lab 12/24/15 0726  WBC 12.8*  HGB 13.9  HCT 41.4  PLT 217   ------------------------------------------------------------------------------------------------------------------  Chemistries   Recent Labs Lab 12/24/15 1818  NA 137  K 3.9  CL 98*  CO2 31  GLUCOSE 210*  BUN 29*  CREATININE 0.49  CALCIUM 8.7*  MG 2.1   ------------------------------------------------------------------------------------------------------------------  Cardiac Enzymes  Recent Labs Lab 12/25/15 0636  TROPONINI <0.03   ------------------------------------------------------------------------------------------------------------------  RADIOLOGY:  No results found.  EKG:  No orders found for this or any previous visit.  ASSESSMENT AND PLAN:  Acute on chronic respiratory failure with hypoxia, hypercapnia secondary to COPD exacerbation, viral bronchitis: Intubated on 12/5, extubated and transferred to regular floor. Patient feels better today. No hypoxia. On 2 L saturation 94%. Continue IV steroids, nebulizers, watch closely. blood showed staph species, white count is elevated. Continue empiric antibiotics, follow WBC, fever curve.rpt blood cultures today,chech cbc today.  #2 SVT: .Improved. #3 hypertension: Controlled 4.Nutrition ;dysphagia 2 diet with thin liquids; seen by speech therapy.  #5 deconditioning, generalized weakness: Physical therapy consulted.   All the records are reviewed and case discussed with Care Management/Social Workerr. Management plans discussed with the patient, family and they are in agreement.  CODE STATUS: full code  TOTAL TIME TAKING CARE OF THIS PATIENT: 35 minutes.   POSSIBLE D/C IN 1-2 DAYS, DEPENDING ON CLINICAL CONDITION.   Katha HammingKONIDENA,Estellar Cadena M.D on  12/27/2015 at 11:07 AM  Between 7am to 6pm - Pager - [phone]  After 6pm go to [email or web address] - password [email or web address]  [facility] [place] [facility]  Office  [phone]  CC: Primary care physician; Rolm GalaGRANDIS, HEIDI, MD   Note: This dictation was prepared with Dragon dictation along with smaller phrase technology. Any transcriptional errors that result from this process are unintentional.

## 2015-12-27 NOTE — Evaluation (Signed)
Physical Therapy Evaluation Patient Details Name: Claudia Mercado MRN: [id number] DOB: 18-May-1940 Today's Date: 12/27/2015   History of Present Illness  presented to ER secondary to decreased responsiveness; admitted with acute/chronic respiratory failure secondary to COPD exacerbation, acute bronchitis.  Requiring BiPAP with subsequent intubation 12/5-12/7; currently on 2L supplemental O2.  Hospital course additionally complicated by delirium/agitation requiring precedex; currently alert, oriented and cooperative.  Clinical Impression  Upon evaluation, patient alert and oriented; follows all commands and demonstrates fair insight.  Generally impulsive with limited awareness of need for activity pacing/energy conservation to manage chronic respiratory state.  Currently requires min assist for sit/stand and basic transfers; min/mod assist for short-distance gait (40') with L lateral LOB (x2) requiring physical assist from therapist to recover/prevent fall.  Generally unsteady and unsafe to complete without RW and +1 at all times. Marked desat to 85% with minimal activity requiring 45-60 seconds seated rest and pursed lip breathing for recovery to >90% on 2L.  Lacks cardiopulmonary endurance required to facilitate safe discharge home alone at this time. Would benefit from skilled PT to address above deficits and promote optimal return to PLOF; recommend transition to STR upon discharge from acute hospitalization.     Follow Up Recommendations SNF    Equipment Recommendations  Rolling walker with 5" wheels    Recommendations for Other Services       Precautions / Restrictions Precautions Precautions: Fall Restrictions Weight Bearing Restrictions: No      Mobility  Bed Mobility Overal bed mobility: Modified Independent                Transfers Overall transfer level: Needs assistance Equipment used: Rolling walker (2 wheeled) Transfers: Sit to/from Stand Sit to Stand: Min  guard;Min assist         General transfer comment: cuing for hand placement; generally impulsive  Ambulation/Gait Ambulation/Gait assistance: Min assist;Mod assist Ambulation Distance (Feet): 40 Feet Assistive device: Rolling walker (2 wheeled)       General Gait Details: reciprocal stepping with increased sway bilat; L lateral LOB x2 (esp with turn negotiation), requiring therapist assist for correction.  Distance limited by SOB, desat (85% on 2L with minimal activity) requiring extended seated rest period for recovery to >90%.  Poor awareness of need for activity pacing  Stairs            Wheelchair Mobility    Modified Rankin (Stroke Patients Only)       Balance Overall balance assessment: Needs assistance Sitting-balance support: No upper extremity supported;Feet supported Sitting balance-Leahy Scale: Good     Standing balance support: Bilateral upper extremity supported Standing balance-Leahy Scale: Poor Standing balance comment: lateral LOB with turns, changes of direction requiring therapist assist for correction                             Pertinent Vitals/Pain Pain Assessment: No/denies pain    Home Living Family/patient expects to be discharged to:: Private residence Living Arrangements: Alone Available Help at Discharge: Family;Available PRN/intermittently Type of Home: House Home Access: Stairs to enter Entrance Stairs-Rails: Left Entrance Stairs-Number of Steps: 5 Home Layout: One level        Prior Function Level of Independence: Independent         Comments: Indep with ADLs, household and community mobility; + driving, does her own grocery shopping.  Sister provides check in/assist intermittently as needed. Home O2 at 2L     Hand Dominance  Extremity/Trunk Assessment   Upper Extremity Assessment: Generalized weakness           Lower Extremity Assessment: Generalized weakness (grossly 4-/5 throughout)          Communication   Communication: No difficulties  Cognition Arousal/Alertness: Awake/alert Behavior During Therapy: WFL for tasks assessed/performed Overall Cognitive Status: Within Functional Limits for tasks assessed                      General Comments      Exercises     Assessment/Plan    PT Assessment Patient needs continued PT services  PT Problem List Decreased strength;Decreased activity tolerance;Decreased balance;Decreased mobility;Decreased knowledge of use of DME;Decreased safety awareness;Decreased knowledge of precautions;Cardiopulmonary status limiting activity          PT Treatment Interventions DME instruction;Gait training;Stair training;Functional mobility training;Therapeutic activities;Therapeutic exercise;Balance training;Patient/family education    PT Goals (Current goals can be found in the Care Plan section)  Acute Rehab PT Goals Patient Stated Goal: to go to rehab and get my strength back PT Goal Formulation: With patient Time For Goal Achievement: 01/10/16 Potential to Achieve Goals: Good    Frequency Min 2X/week   Barriers to discharge Decreased caregiver support      Co-evaluation               End of Session Equipment Utilized During Treatment: Gait belt;Oxygen Activity Tolerance: Patient limited by fatigue Patient left: in chair;with call bell/phone within reach;with chair alarm set           Time: [id number] PT Time Calculation (min) (ACUTE ONLY): 18 min   Charges:   PT Evaluation $PT Eval Moderate Complexity: 1 Procedure     PT G Codes:        Tashima Scarpulla H. Manson PasseyBrown, PT, DPT, NCS 12/27/15, 12:01 PM [phone]

## 2015-12-27 NOTE — Clinical Social Work Note (Signed)
CS[facility] for [facility] for STR. York SpanielMonica Geraldine Tesar MSW,LCSW [phone]

## 2015-12-28 LAB — GLUCOSE, CAPILLARY
GLUCOSE-CAPILLARY: 193 mg/dL — AB (ref 65–99)
GLUCOSE-CAPILLARY: 217 mg/dL — AB (ref 65–99)
Glucose-Capillary: 126 mg/dL — ABNORMAL HIGH (ref 65–99)

## 2015-12-28 MED ORDER — DILTIAZEM 12 MG/ML ORAL SUSPENSION: 60.0000 mg | Freq: Four times a day (QID) | ORAL | 0 refills | Status: AC

## 2015-12-28 MED ORDER — AMOXICILLIN-POT CLAVULANATE 875-125 MG PO TABS: 1.0000 | ORAL_TABLET | Freq: Two times a day (BID) | ORAL | 0 refills | Status: AC

## 2015-12-28 MED ORDER — PREDNISONE 10 MG (21) PO TBPK: 10.0000 mg | ORAL_TABLET | Freq: Every day | ORAL | 0 refills | Status: AC

## 2015-12-28 MED ORDER — QUETIAPINE FUMARATE ER 50 MG PO TB24: 50.0000 mg | ORAL_TABLET | Freq: Every day | ORAL | 0 refills | Status: AC

## 2015-12-28 MED ORDER — ENSURE ENLIVE PO LIQD: 237.0000 mL | Freq: Two times a day (BID) | ORAL | 12 refills | Status: AC

## 2015-12-28 MED ORDER — BUDESONIDE 0.5 MG/2ML IN SUSP: 0.5000 mg | Freq: Two times a day (BID) | RESPIRATORY_TRACT | 12 refills | Status: AC

## 2015-12-28 NOTE — Discharge Summary (Signed)
Claudia Mercado, is a 75 y.o. female  DOB [birth year]  MRN [id number].  Admission date:  12/18/2015  Admitting Physician  Erin FullingKurian Kasa, MD  Discharge Date:  12/28/2015   Primary MD  Rolm GalaGRANDIS, HEIDI, MD  Recommendations for primary care physician for things to follow:   Follow-up with primary doctor in 1 week.   Admission Diagnosis  Hypercarbia [R06.89] Respiratory distress [R06.00] COPD exacerbation (HCC) [J44.1] Acute respiratory failure with hypoxia (HCC) [J96.01]   Discharge Diagnosis  Hypercarbia [R06.89] Respiratory distress [R06.00] COPD exacerbation (HCC) [J44.1] Acute respiratory failure with hypoxia (HCC) [J96.01]    Active Problems:   Respiratory failure (HCC)   Malnutrition of moderate degree      Past Medical History:  Diagnosis Date  . COPD (chronic obstructive pulmonary disease) (HCC)   . Elevated blood pressure reading     History reviewed. No pertinent surgical history.     History of present illness and  Hospital Course:     Kindly see H&P for history of present illness and admission details, please review complete Labs, Consult reports and Test reports for all details in brief  HPI  from the history and physical done on the day of admission 75 year old female patient with history of COPD found by sister slumped over and then repeating. Admitted to ICU because of acute respiratory failure with hypoxia. O2 saturations 88% on 3 L. Patient initially  started on BiPAP, admitted to ICU she had severe wheezing, increased work of breathing.   Hospital Course   #1. Acute on chronic respiratory failure secondary to COPD exacerbation from viral bronchitis: Initially admitted to intensive care unit, attempted BiPAP therapy, IV steroids, IV antibiotics, patient was given IV Rocephin. Her respirations  did not get better, he got more confused, started on Precedex drip, intubated on now December 5. Because of respiratory failure. Blood gas at that time showed pH 7.48, PCO2 63, PO2 74. continued on full vent support, extubated on December 7. After extubation patient did well. Stop at the Precedex, and transferred out of ICU. Started on Seroquel for agitation. Patient remained asymptomatic, but being improved. Physical therapy recommended rehabilitation. Initial blood cultures showed coagulase-negative staph in the one site. Continued on Rocephin. Repeat blood cultures from yesterday that is November 12 did not show any growth. Patient has leukocytosis secondary to steroids but she did not have any fever or hypoxia. Plan to discharge her with Augmentin to finish the course of antibiotics, and continue oxygen, prednisone taper at discharge.  #2 deconditioning: Physical therapy recommended rehabilitation. 3 tachycardia due to deconditioning, respiratory problems: Started on Cardizem. Discharge Condition: stable   Follow UP  Follow-up Information    Rolm GalaGRANDIS, HEIDI, MD Follow up in 1 week(s).   Specialty:  Family Medicine Contact information: [address] [place] [address] [phone]             Discharge Instructions  and  Discharge Medications       Medication List    TAKE these medications   amoxicillin-clavulanate 875-125 MG tablet Commonly known as:  AUGMENTIN Take 1 tablet by mouth 2 (two) times daily.   atorvastatin 40 MG tablet Commonly known as:  LIPITOR Take 1 tablet by mouth daily.   budesonide 0.5 MG/2ML nebulizer solution Commonly known as:  PULMICORT Take 2 mLs (0.5 mg total) by nebulization 2 (two) times daily.   diltiazem 10 mg/ml oral suspension Commonly known as:  CARDIZEM Take 6 mLs (60 mg total) by mouth every 6 (six)  hours.   feeding supplement (ENSURE ENLIVE) Liqd Take 237 mLs by mouth 2 (two) times daily between meals.   gabapentin 300 MG  capsule Commonly known as:  NEURONTIN Take 1 capsule by mouth 3 (three) times daily.   nitroGLYCERIN 0.4 MG SL tablet Commonly known as:  NITROSTAT Place 1 tablet under the tongue every 5 (five) minutes x 3 doses as needed. Place 1 tablet under tongue every 5 minutes as needed for chest pain for up to 3 doses   QUEtiapine 50 MG Tb24 24 hr tablet Commonly known as:  SEROQUEL XR Take 1 tablet (50 mg total) by mouth at bedtime.   SPIRIVA HANDIHALER 18 MCG inhalation capsule Generic drug:  tiotropium Place 1 capsule into inhaler and inhale as needed.   SYMBICORT 160-4.5 MCG/ACT inhaler Generic drug:  budesonide-formoterol Inhale 2 puffs into the lungs 2 (two) times daily.   VENTOLIN HFA 108 (90 Base) MCG/ACT inhaler Generic drug:  albuterol Inhale 2 puffs into the lungs every 4 (four) hours as needed.         Diet and Activity recommendation: See Discharge Instructions above   Consults obtained - PCCM   Major procedures and Radiology Reports - PLEASE review detailed and final reports for all details, in brief -      Dg Chest 1 View  Result Date: 12/22/2015 CLINICAL DATA:  COPD, chronic respiratory failure. Patient intubated 12/20/2015. EXAM: CHEST 1 VIEW COMPARISON:  Single-view of the chest 12/21/2015 and 12/20/2015. FINDINGS: ETT and NG tube remain in place. Right pleural effusion and basilar airspace disease are unchanged. Lungs are emphysematous. Bullous lesion on the left is noted. IMPRESSION: Support apparatus projects in good position. No change in right basilar airspace disease and pleural effusion. Emphysema. Electronically Signed   By: Drusilla Kanner M.D.   On: 12/22/2015 08:06   Dg Chest 1 View  Result Date: 12/21/2015 CLINICAL DATA:  Dyspnea EXAM: CHEST 1 VIEW COMPARISON:  12/20/2015 FINDINGS: Endotracheal tube and nasogastric catheter are noted in satisfactory position. Cardiac shadow is stable. Aortic calcifications are noted. Mild tortuosity of the thoracic  aorta is again seen. Elevation the right hemidiaphragm is noted with mild right basilar atelectasis. No focal confluent infiltrate is seen. Emphysematous changes are noted on the left. IMPRESSION: Predominately chronic changes with the exception of right basilar atelectasis. Electronically Signed   By: Alcide Clever M.D.   On: 12/21/2015 07:42   US Renal  Result Date: 12/20/2015 CLINICAL DATA:  Gross hematuria. EXAM: RENAL / URINARY TRACT ULTRASOUND COMPLETE COMPARISON:  12/20/2015. FINDINGS: Right Kidney: Length: 10.8 cm. Irregular contour consistent with scarring.Echogenicity within normal limits. No mass or hydronephrosis visualized. Left Kidney: Length: 11.5 cm. Irregular contour consistent with scarring. Echogenicity within normal limits. No mass or hydronephrosis visualized. Bladder: Not visualized. Patient's Foley catheter. Exam limited due to patient breathing. IMPRESSION: 1.  Irregular renal contour suggesting scarring. 2.  Exam otherwise unremarkable. Electronically Signed   By: Maisie Fus  Register   On: 12/20/2015 10:07   Dg Chest Port 1 View  Result Date: 12/24/2015 CLINICAL DATA:  Acute respiratory failure EXAM: PORTABLE CHEST 1 VIEW COMPARISON:  12/22/2015 FINDINGS: Endotracheal tube removed.  NG removed. Improvement in right lower lobe airspace disease. Small right pleural effusion unchanged. Left lung is clear. Negative for heart failure IMPRESSION: Endotracheal tube and NG tube removed. Improvement in right lower lobe airspace disease. Small right effusion remains. Electronically Signed   By: Marlan Palau M.D.   On: 12/24/2015 07:02   Dg Chest [facility]  1 View  Result Date: 12/20/2015 CLINICAL DATA:  Post intubation.  History of COPD. EXAM: PORTABLE CHEST 1 VIEW COMPARISON:  Chest radiograph December 19, 2015 FINDINGS: Endotracheal tube tip projects 1 cm above the carina. Cardiomediastinal silhouette is nonsuspicious, calcified aortic knob. Similar chronic interstitial changes. Small RIGHT  pleural effusion and RIGHT lung base strandy densities. No pneumothorax. Soft tissue planes and included osseous structures are unchanged. IMPRESSION: Endotracheal tube tip projects 1 cm above the carina, recommend 1 cm of retraction. Small RIGHT pleural effusion and RIGHT lung base atelectasis. Stable chronic interstitial changes. Electronically Signed   By: Awilda Metro M.D.   On: 12/20/2015 03:57   Dg Chest Port 1 View  Result Date: 12/19/2015 CLINICAL DATA:  75 year old female with recent respiratory distress. COPD. Initial encounter. EXAM: PORTABLE CHEST 1 VIEW COMPARISON:  12/18/2015. FINDINGS: Portable AP upright view at 0528 hours. Mildly more kyphotic positioning the knee yesterday. Stable cardiac size and mediastinal contours. Stable or perhaps mildly regressed pulmonary interstitial opacity with no pneumothorax. Stable blunting of the right lung base. No other confluent pulmonary opacity. No left pleural effusion suspected. IMPRESSION: Stable chest. Continued opacity at the right lung base which could reflect small pleural effusion and/or airspace disease, and suspected underlying chronic lung disease. Electronically Signed   By: Odessa Fleming M.D.   On: 12/19/2015 07:38   Dg Chest Portable 1 View  Result Date: 12/18/2015 CLINICAL DATA:  75 year old female with history of respiratory distress. History of COPD. EXAM: PORTABLE CHEST 1 VIEW COMPARISON:  No priors. FINDINGS: Probable small to moderate-sized subpulmonic fluid collection in the right hemithorax. No acute consolidative airspace disease. No pneumothorax. Emphysematous changes. No evidence of pulmonary edema. Mild diffuse peribronchial cuffing. Heart size is normal. The patient is rotated to the right on today's exam, resulting in distortion of the mediastinal contours and reduced diagnostic sensitivity and specificity for mediastinal pathology. Atherosclerosis in the thoracic aorta. IMPRESSION: 1. Probable mild small to moderate right  subpulmonic pleural effusion. 2. Diffuse peribronchial cuffing, concerning for an acute bronchitis. 3. Aortic atherosclerosis. 4. Emphysema. Electronically Signed   By: Trudie Reed M.D.   On: 12/18/2015 13:51   Dg Abd Portable 1v  Result Date: 12/20/2015 CLINICAL DATA:  Orogastric tube placement EXAM: PORTABLE ABDOMEN - 1 VIEW COMPARISON:  None. FINDINGS: The orogastric tube extends into the stomach with tip in the expected location of the mid gastric body. IMPRESSION: Orogastric tube extends into the stomach. Electronically Signed   By: Ellery Plunk M.D.   On: 12/20/2015 05:24    Micro Results    Recent Results (from the past 240 hour(s))  Culture, blood (Routine x 2)     Status: None   Collection Time: 12/18/15  1:07 PM  Result Value Ref Range Status   Specimen Description BLOOD LEFT HAND  Final   Special Requests   Final    BOTTLES DRAWN AEROBIC AND ANAEROBIC AER 7CC ANA 9CC   Culture NO GROWTH 5 DAYS  Final   Report Status 12/23/2015 FINAL  Final  Culture, blood (Routine x 2)     Status: Abnormal   Collection Time: 12/18/15  1:12 PM  Result Value Ref Range Status   Specimen Description BLOOD LEFT ARM  Final   Special Requests   Final    BOTTLES DRAWN AEROBIC AND ANAEROBIC  AER 7CC ANA 5CC   Culture  Setup Time   Final    Organism ID to follow GRAM POSITIVE COCCI IN BOTH AEROBIC AND ANAEROBIC BOTTLES  CRITICAL RESULT CALLED TO, READ BACK BY AND VERIFIED WITH: KAREN HAYES ON 12/19/15 AT 0807 BY [facility]    Culture (A)  Final    STAPHYLOCOCCUS SPECIES (COAGULASE NEGATIVE) THE SIGNIFICANCE OF ISOLATING THIS ORGANISM FROM A SINGLE SET OF BLOOD CULTURES WHEN MULTIPLE SETS ARE DRAWN IS UNCERTAIN. PLEASE NOTIFY THE MICROBIOLOGY DEPARTMENT WITHIN ONE WEEK IF SPECIATION AND SENSITIVITIES ARE REQUIRED. [facility]    Report Status 12/21/2015 FINAL  Final  Blood Culture ID Panel (Reflexed)     Status: Abnormal   Collection Time: 12/18/15  1:12 PM  Result Value  Ref Range Status   Enterococcus species NOT DETECTED NOT DETECTED Final   Listeria monocytogenes NOT DETECTED NOT DETECTED Final   Staphylococcus species DETECTED (A) NOT DETECTED Final    Comment: CRITICAL RESULT CALLED TO, READ BACK BY AND VERIFIED WITH: KAREN HAYES ON 12/19/15 AT 0807 BY [facility]    Staphylococcus aureus NOT DETECTED NOT DETECTED Final   Methicillin resistance NOT DETECTED NOT DETECTED Final   Streptococcus species NOT DETECTED NOT DETECTED Final   Streptococcus agalactiae NOT DETECTED NOT DETECTED Final   Streptococcus pneumoniae NOT DETECTED NOT DETECTED Final   Streptococcus pyogenes NOT DETECTED NOT DETECTED Final   Acinetobacter baumannii NOT DETECTED NOT DETECTED Final   Enterobacteriaceae species NOT DETECTED NOT DETECTED Final   Enterobacter cloacae complex NOT DETECTED NOT DETECTED Final   Escherichia coli NOT DETECTED NOT DETECTED Final   Klebsiella oxytoca NOT DETECTED NOT DETECTED Final   Klebsiella pneumoniae NOT DETECTED NOT DETECTED Final   Proteus species NOT DETECTED NOT DETECTED Final   Serratia marcescens NOT DETECTED NOT DETECTED Final   Haemophilus influenzae NOT DETECTED NOT DETECTED Final   Neisseria meningitidis NOT DETECTED NOT DETECTED Final   Pseudomonas aeruginosa NOT DETECTED NOT DETECTED Final   Candida albicans NOT DETECTED NOT DETECTED Final   Candida glabrata NOT DETECTED NOT DETECTED Final   Candida krusei NOT DETECTED NOT DETECTED Final   Candida parapsilosis NOT DETECTED NOT DETECTED Final   Candida tropicalis NOT DETECTED NOT DETECTED Final  MRSA PCR Screening     Status: None   Collection Time: 12/18/15  3:50 PM  Result Value Ref Range Status   MRSA by PCR NEGATIVE NEGATIVE Final    Comment:        The GeneXpert MRSA Assay (FDA approved for NASAL specimens only), is one component of a comprehensive MRSA colonization surveillance program. It is not intended to diagnose MRSA infection nor to guide or monitor treatment  for MRSA infections.   Urine culture     Status: None   Collection Time: 12/20/15  7:50 AM  Result Value Ref Range Status   Specimen Description URINE, RANDOM  Final   Special Requests NONE  Final   Culture NO GROWTH [facility]   Final   Report Status 12/21/2015 FINAL  Final  Culture, expectorated sputum-assessment     Status: None   Collection Time: 12/20/15  9:01 AM  Result Value Ref Range Status   Specimen Description TRACHEAL ASPIRATE  Final   Special Requests NONE  Final   Sputum evaluation   Final    SPUT EVAL CREDITED, NOT PERFORMED ON TRACH ASP. RESP CULTURE ORDERED. SGD   Report Status 12/20/2015 FINAL  Final  Culture, respiratory (NON-Expectorated)     Status: None   Collection Time: 12/20/15  9:01 AM  Result Value Ref Range Status   Specimen Description TRACHEAL ASPIRATE  Final  Special Requests NONE  Final   Gram Stain   Final    ABUNDANT WBC PRESENT, PREDOMINANTLY PMN RARE YEAST Performed at [facility]    Culture FEW CANDIDA ALBICANS FEW CANDIDA GLABRATA   Final   Report Status 12/24/2015 FINAL  Final  CULTURE, BLOOD (ROUTINE X 2) w Reflex to ID Panel     Status: None (Preliminary result)   Collection Time: 12/27/15 12:12 PM  Result Value Ref Range Status   Specimen Description BLOOD L AC  Final   Special Requests BOTTLES DRAWN AEROBIC AND ANAEROBIC  Final   Culture NO GROWTH < 24 HOURS  Final   Report Status PENDING  Incomplete  CULTURE, BLOOD (ROUTINE X 2) w Reflex to ID Panel     Status: None (Preliminary result)   Collection Time: 12/27/15 12:12 PM  Result Value Ref Range Status   Specimen Description BLOOD R WRIST  Final   Special Requests BOTTLES DRAWN AEROBIC AND ANAEROBIC  Final   Culture NO GROWTH < 24 HOURS  Final   Report Status PENDING  Incomplete       Today   Subjective:   Claudia Mercado today has No shortness of breath stable for discharge to rehabilitation and arrangements are  made  Objective:   Blood pressure 130/66, pulse 81, temperature 97.7 F (36.5 C), temperature source Oral, resp. rate 19, height 5\' 5"  (1.651 m), weight 66.2 kg (146 lb), SpO2 93 %.   Intake/Output Summary (Last 24 hours) at 12/28/15 1029 Last data filed at 12/28/15 0030  Gross per 24 hour  Intake              170 ml  Output             1150 ml  Net             -980 ml    Exam Awake Alert, Oriented x 3, No new F.N deficits, Normal affect [place].AT,PERRAL Supple Neck,No JVD, No cervical lymphadenopathy appriciated.  Symmetrical Chest wall movement, Good air movement bilaterally, CTAB RRR,No Gallops,Rubs or new Murmurs, No Parasternal Heave +ve B.Sounds, Abd Soft, Non tender, No organomegaly appriciated, No rebound -guarding or rigidity. No Cyanosis, Clubbing or edema, No new Rash or bruise  Data Review   CBC w Diff: Lab Results  Component Value Date   WBC 13.7 (H) 12/27/2015   HGB 13.9 12/27/2015   HCT 42.1 12/27/2015   PLT 225 12/27/2015   LYMPHOPCT 4 12/24/2015   MONOPCT 3 12/24/2015   EOSPCT 0 12/24/2015   BASOPCT 1 12/24/2015    CMP: Lab Results  Component Value Date   NA 137 12/24/2015   K 3.9 12/24/2015   CL 98 (L) 12/24/2015   CO2 31 12/24/2015   BUN 29 (H) 12/24/2015   CREATININE 0.49 12/24/2015   PROT 7.8 12/18/2015   ALBUMIN 3.8 12/18/2015   BILITOT 0.5 12/18/2015   ALKPHOS 97 12/18/2015   AST 27 12/20/2015   ALT 16 12/20/2015  .   Total Time in preparing paper work, data evaluation and todays exam - 35 minutes  Elaine Roanhorse M.D on 12/28/2015 at 10:29 AM    Note: This dictation was prepared with Dragon dictation along with smaller phrase technology. Any transcriptional errors that result from this process are unintentional.

## 2015-12-28 NOTE — Clinical Social Work Note (Signed)
[facility] has offered a bed to patient and patient has accepted. CSW has received auth from patient's insurance: [phone] [id number] and [facility]. Discharge information sent to [facility].  York SpanielMonica Sabas Frett MSW,LCSW [phone]

## 2015-12-28 NOTE — Discharge Planning (Addendum)
Patient IV and tele removed.  Discharge papers given, explained and educated.  Informed of suggested FU appts and appts made.  Script in packet.  Patient  leaving hospital in no pain. RN assessment and VS revealed stability for DC to [facility]. Report called and SW Saunders RevelElizabeth Kelly, RN.  When ready, patient will be wheeled to front and family transporting to room E4 via car. Packet sent with patient.

## 2016-01-01 LAB — CULTURE, BLOOD (ROUTINE X 2)
CULTURE: NO GROWTH
CULTURE: NO GROWTH

## 2018-08-28 IMAGING — DX DG CHEST 1V PORT
1 series · 1 of 1 positions shown · non-contrast
Comparison: 12/18/2015.

CLINICAL DATA: 75-year-old female with recent respiratory distress.
COPD. Initial encounter.

EXAM:
PORTABLE CHEST 1 VIEW

[chest ap]
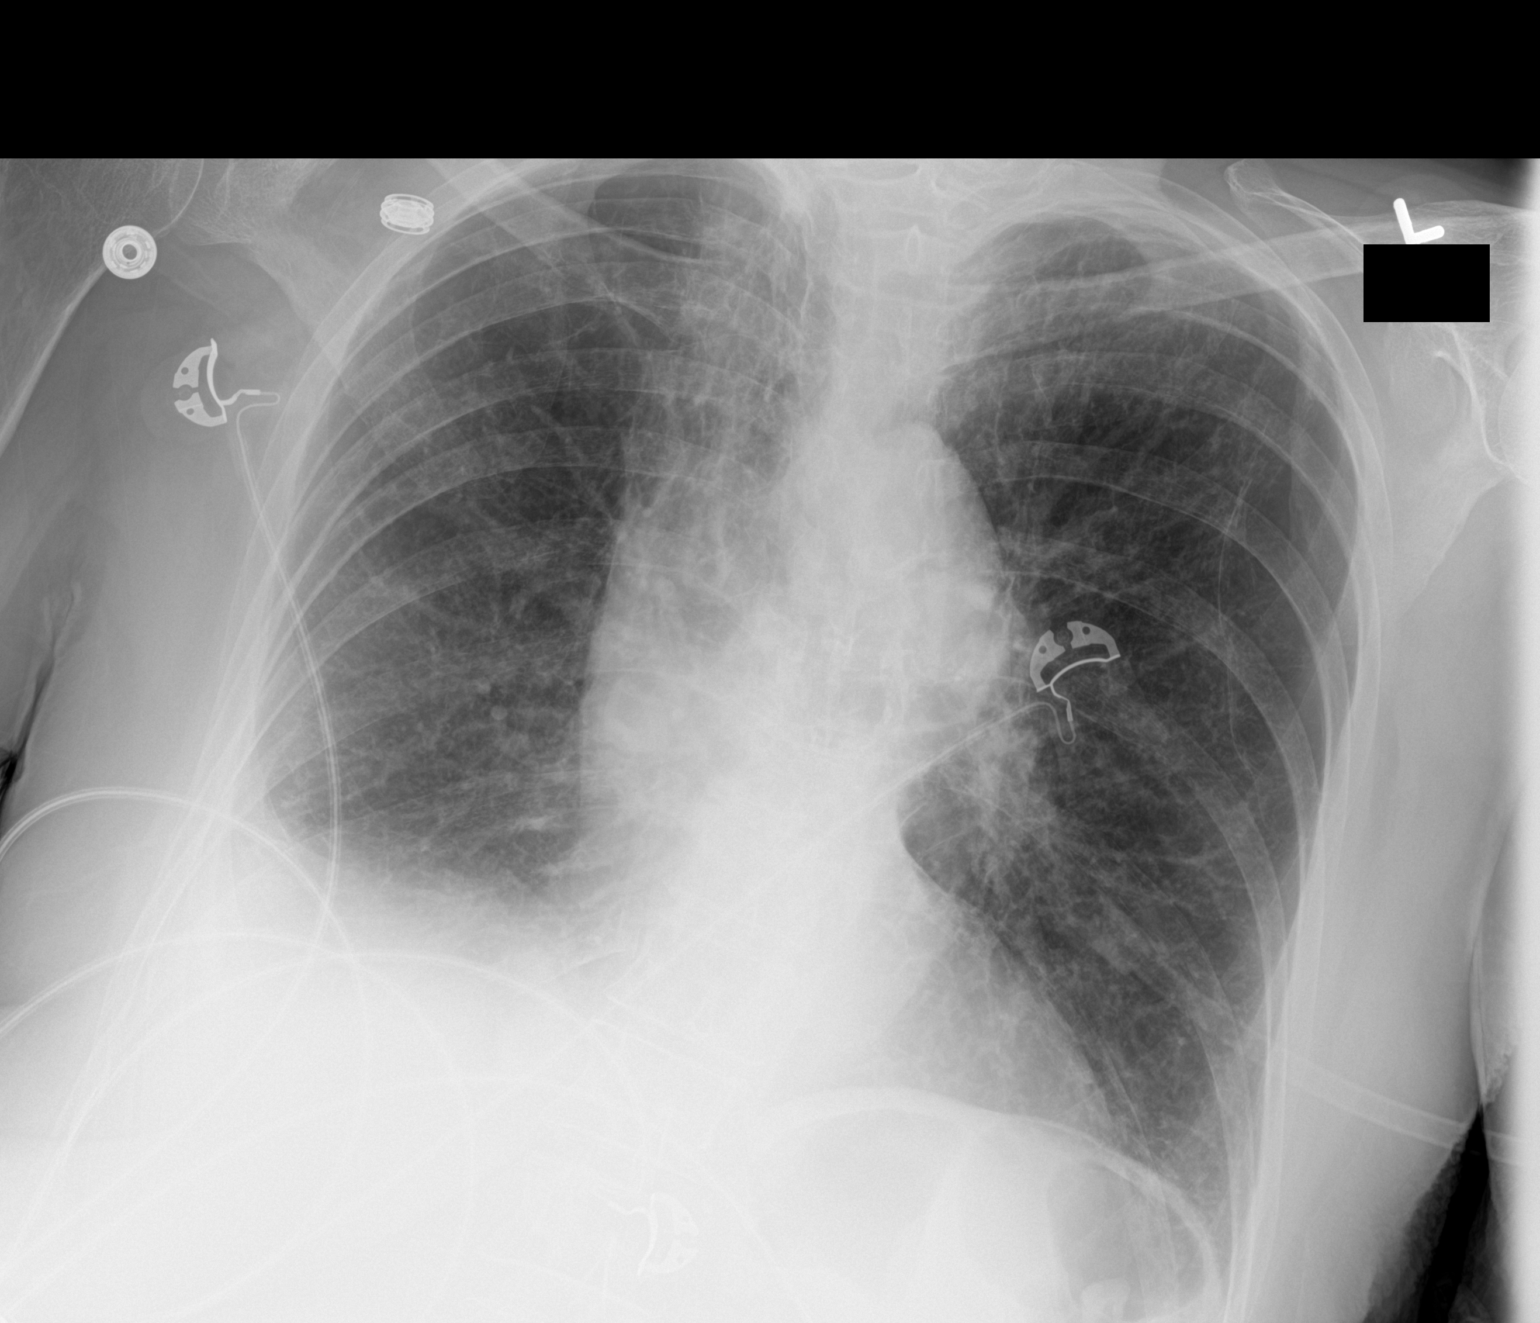

[1 of 1 positions shown; findings below may reference images not displayed]

FINDINGS: Portable AP upright view at 2546 hours. Mildly more kyphotic
positioning the knee yesterday. Stable cardiac size and mediastinal
contours. Stable or perhaps mildly regressed pulmonary interstitial
opacity with no pneumothorax. Stable blunting of the right lung
base. No other confluent pulmonary opacity. No left pleural effusion
suspected.
IMPRESSION: Stable chest. Continued opacity at the right lung base which could
reflect small pleural effusion and/or airspace disease, and
suspected underlying chronic lung disease.

## 2018-08-30 IMAGING — DX DG CHEST 1V
1 series · 1 of 1 positions shown · non-contrast
Comparison: 12/20/2015

CLINICAL DATA: Dyspnea

EXAM:
CHEST 1 VIEW

[chest ap]
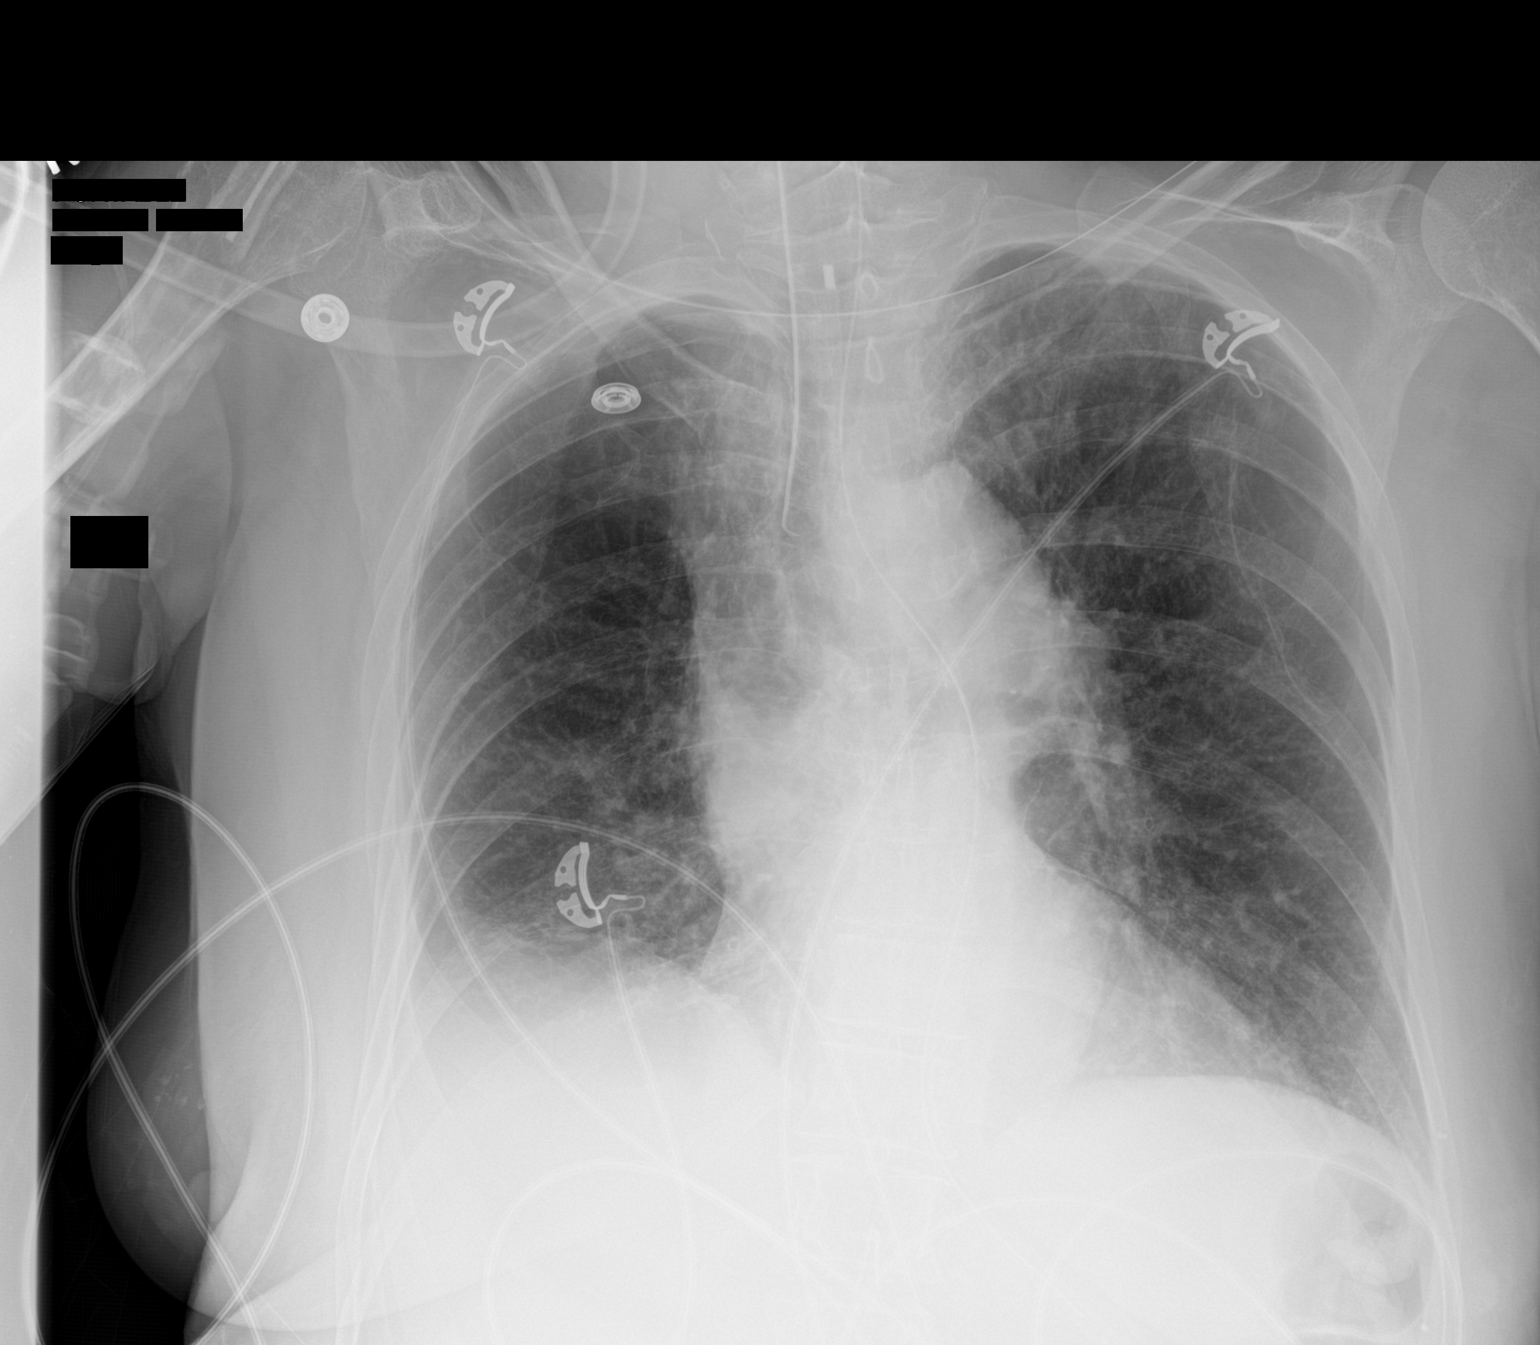

[1 of 1 positions shown; findings below may reference images not displayed]

FINDINGS: Endotracheal tube and nasogastric catheter are noted in satisfactory
position. Cardiac shadow is stable. Aortic calcifications are noted.
Mild tortuosity of the thoracic aorta is again seen. Elevation the
right hemidiaphragm is noted with mild right basilar atelectasis. No
focal confluent infiltrate is seen. Emphysematous changes are noted
on the left.
IMPRESSION: Predominately chronic changes with the exception of right basilar
atelectasis.

## 2018-09-02 IMAGING — DX DG CHEST 1V PORT
1 series · 1 of 1 positions shown · non-contrast
Comparison: 12/22/2015

CLINICAL DATA: Acute respiratory failure

EXAM:
PORTABLE CHEST 1 VIEW

[chest ap]
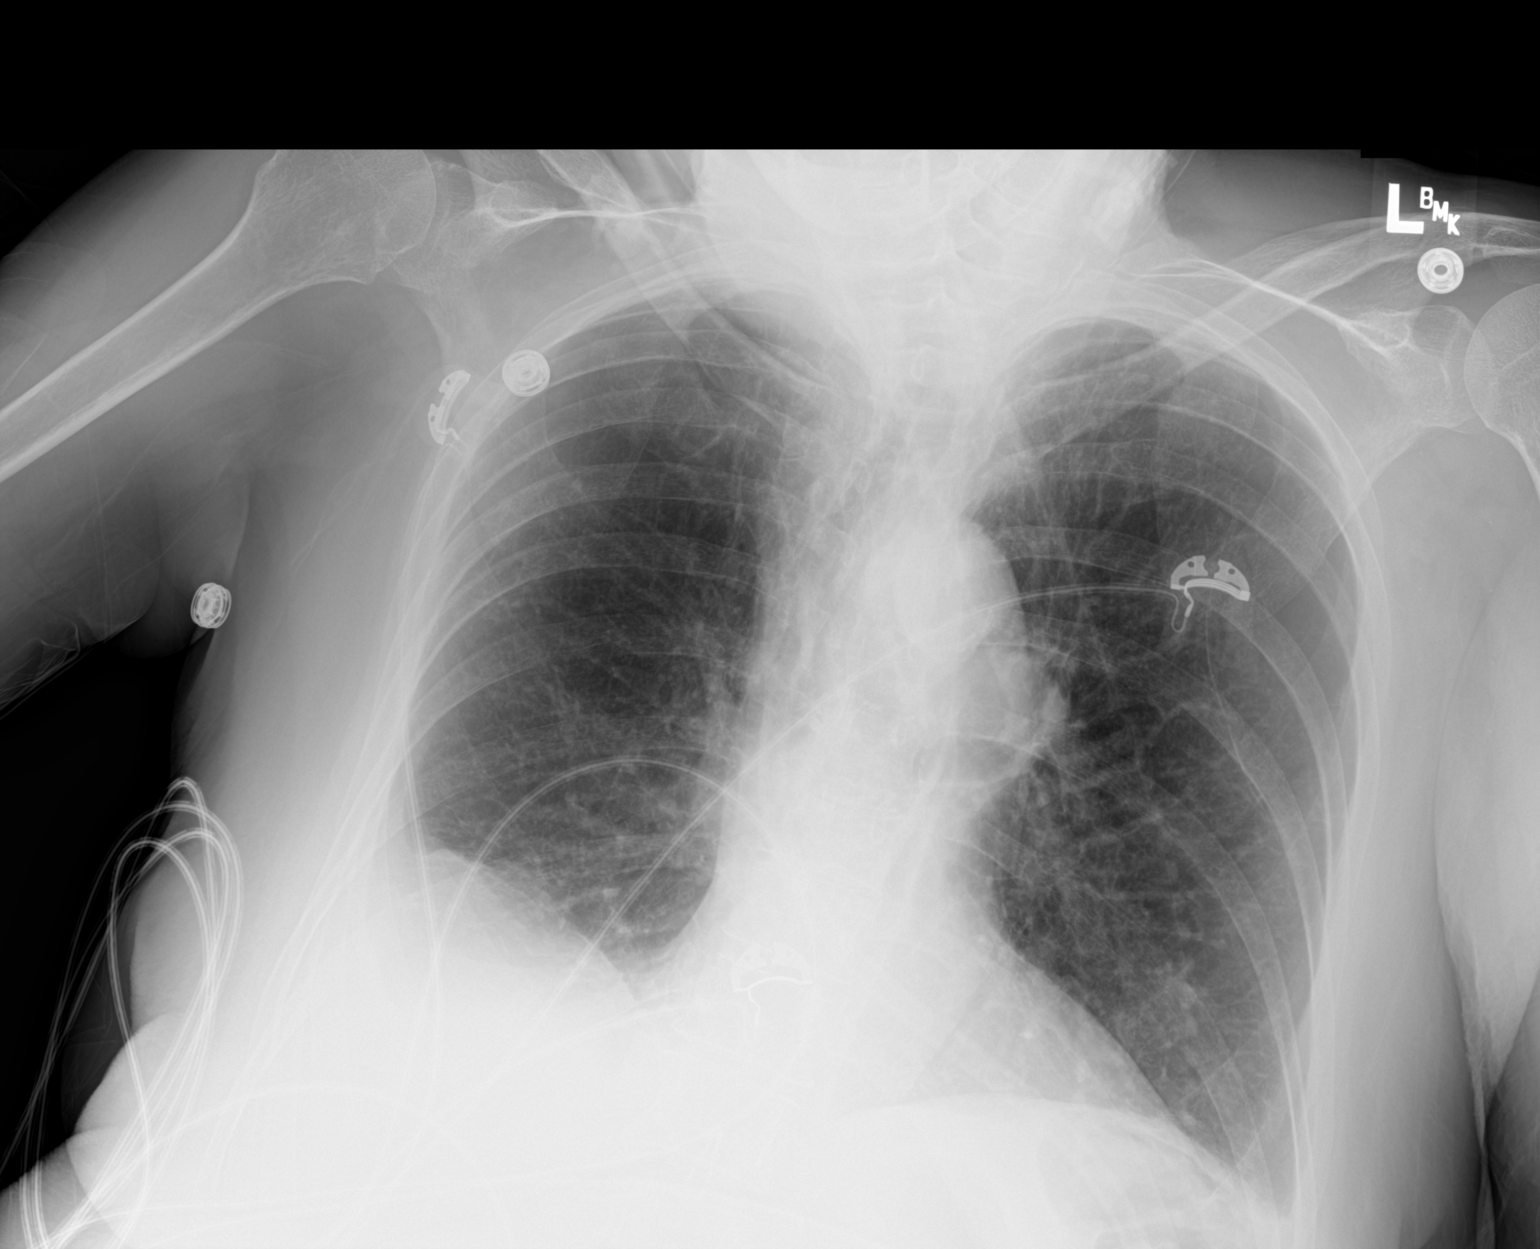

[1 of 1 positions shown; findings below may reference images not displayed]

FINDINGS: Endotracheal tube removed.  NG removed.

Improvement in right lower lobe airspace disease. Small right
pleural effusion unchanged. Left lung is clear. Negative for heart
failure
IMPRESSION: Endotracheal tube and NG tube removed.

Improvement in right lower lobe airspace disease. Small right
effusion remains.

## 2020-10-15 DEATH — deceased
